# Patient Record
Sex: Female | Born: 1985 | Race: White | Hispanic: No | Marital: Single | Smoking: Current every day smoker
Health system: Southern US, Community
[De-identification: ages and names within clinical notes are randomized; demographics above are authoritative.]

## PROBLEM LIST (undated history)

## (undated) DIAGNOSIS — E119 Type 2 diabetes mellitus without complications: Secondary | ICD-10-CM

## (undated) DIAGNOSIS — F32A Depression, unspecified: Secondary | ICD-10-CM

## (undated) DIAGNOSIS — I10 Essential (primary) hypertension: Secondary | ICD-10-CM

## (undated) DIAGNOSIS — F329 Major depressive disorder, single episode, unspecified: Secondary | ICD-10-CM

## (undated) HISTORY — PX: GASTROSTOMY W/ FEEDING TUBE: SUR642

## (undated) HISTORY — PX: APPENDECTOMY: SHX54

---

## 2016-08-26 ENCOUNTER — Emergency Department (HOSPITAL_COMMUNITY)
Admission: EM | Admit: 2016-08-26 | Discharge: 2016-08-26 | Disposition: A | Discharge status: Home / Self Care | Payer: Medicaid Other | Attending: Emergency Medicine | Admitting: Emergency Medicine

## 2016-08-26 ENCOUNTER — Encounter (HOSPITAL_COMMUNITY): Payer: Self-pay | Admitting: Emergency Medicine

## 2016-08-26 DIAGNOSIS — F17200 Nicotine dependence, unspecified, uncomplicated: Secondary | ICD-10-CM | POA: Diagnosis not present

## 2016-08-26 DIAGNOSIS — R739 Hyperglycemia, unspecified: Secondary | ICD-10-CM

## 2016-08-26 DIAGNOSIS — R1030 Lower abdominal pain, unspecified: Secondary | ICD-10-CM | POA: Diagnosis present

## 2016-08-26 DIAGNOSIS — E1165 Type 2 diabetes mellitus with hyperglycemia: Secondary | ICD-10-CM | POA: Insufficient documentation

## 2016-08-26 DIAGNOSIS — I10 Essential (primary) hypertension: Secondary | ICD-10-CM | POA: Diagnosis not present

## 2016-08-26 DIAGNOSIS — N946 Dysmenorrhea, unspecified: Secondary | ICD-10-CM

## 2016-08-26 HISTORY — DX: Essential (primary) hypertension: I10

## 2016-08-26 HISTORY — DX: Depression, unspecified: F32.A

## 2016-08-26 HISTORY — DX: Major depressive disorder, single episode, unspecified: F32.9

## 2016-08-26 HISTORY — DX: Type 2 diabetes mellitus without complications: E11.9

## 2016-08-26 LAB — URINALYSIS, ROUTINE W REFLEX MICROSCOPIC
BILIRUBIN URINE: NEGATIVE
Glucose, UA: NEGATIVE mg/dL
Ketones, ur: NEGATIVE mg/dL
NITRITE: NEGATIVE
PH: 6.5 (ref 5.0–8.0)
Protein, ur: NEGATIVE mg/dL
SPECIFIC GRAVITY, URINE: 1.018 (ref 1.005–1.030)

## 2016-08-26 LAB — CBC
HEMATOCRIT: 38.3 % (ref 36.0–46.0)
HEMOGLOBIN: 12.3 g/dL (ref 12.0–15.0)
MCH: 28.2 pg (ref 26.0–34.0)
MCHC: 32.1 g/dL (ref 30.0–36.0)
MCV: 87.8 fL (ref 78.0–100.0)
Platelets: 336 10*3/uL (ref 150–400)
RBC: 4.36 MIL/uL (ref 3.87–5.11)
RDW: 13.3 % (ref 11.5–15.5)
WBC: 10.6 10*3/uL — ABNORMAL HIGH (ref 4.0–10.5)

## 2016-08-26 LAB — WET PREP, GENITAL
Sperm: NONE SEEN
Trich, Wet Prep: NONE SEEN
Yeast Wet Prep HPF POC: NONE SEEN

## 2016-08-26 LAB — COMPREHENSIVE METABOLIC PANEL
ALBUMIN: 3.8 g/dL (ref 3.5–5.0)
ALT: 16 U/L (ref 14–54)
ANION GAP: 6 (ref 5–15)
AST: 16 U/L (ref 15–41)
Alkaline Phosphatase: 61 U/L (ref 38–126)
BUN: 9 mg/dL (ref 6–20)
CHLORIDE: 104 mmol/L (ref 101–111)
CO2: 25 mmol/L (ref 22–32)
Calcium: 9.3 mg/dL (ref 8.9–10.3)
Creatinine, Ser: 0.72 mg/dL (ref 0.44–1.00)
GFR calc Af Amer: >60 mL/min (ref >60)
GFR calc non Af Amer: >60 mL/min (ref >60)
GLUCOSE: 218 mg/dL — AB (ref 65–99)
POTASSIUM: 3.8 mmol/L (ref 3.5–5.1)
Sodium: 135 mmol/L (ref 135–145)
TOTAL PROTEIN: 7 g/dL (ref 6.5–8.1)
Total Bilirubin: 0.3 mg/dL (ref 0.3–1.2)

## 2016-08-26 LAB — URINE MICROSCOPIC-ADD ON

## 2016-08-26 LAB — I-STAT BETA HCG BLOOD, ED (MC, WL, AP ONLY): I-stat hCG, quantitative: <5 m[IU]/mL (ref <5)

## 2016-08-26 LAB — LIPASE, BLOOD: Lipase: 38 U/L (ref 11–51)

## 2016-08-26 MED ORDER — NAPROXEN 500 MG PO TABS: 500.0000 mg | ORAL_TABLET | Freq: Two times a day (BID) | ORAL | 0 refills | Status: DC

## 2016-08-26 NOTE — ED Provider Notes (Signed)
[facility]-EMERGENCY DEPT Provider Note   CSN: [id number] Arrival date & time: 08/26/16  1515    History   Chief Complaint Chief Complaint  Patient presents with  . Abdominal Pain  . Numbness    HPI Marilyn Joseph is a 30 y.o. female.  30 year old female with a history of hypertension, depression, and diabetes mellitus presents to the emergency department for evaluation of lower abdominal pain. Patient states that she has experienced lower abdominal pain intermittently over the past 3-4 days. She describes the pain as cramping. She has not taken any medications for symptoms. She reports starting her menstrual cycle yesterday. She has also had associated diarrhea 2 days. Diarrhea has been watery and free of blood. No melena. Patient further denies nausea, vomiting, dysuria, fever, chills, or vaginal discharge. She has a history of feeding tube placement at the age of [age]. She also reports prior appendectomy.  As an aside, patient reports intermittent paresthesias from her left elbow extending to her left hand. This has often been positional in nature, such as when she has her arm around her significant other. She denies complete loss of sensation. She has had no recent trauma or injury to her left upper extremity. She denies any associated neck or back pain. She has had no extremity weakness or fever with this.   The history is provided by the patient. No language interpreter was used.  Abdominal Pain   Associated symptoms include diarrhea.    Past Medical History:  Diagnosis Date  . Depression   . Diabetes mellitus without complication (HCC)   . Hypertension     There are no active problems to display for this patient.   Past Surgical History:  Procedure Laterality Date  . GASTROSTOMY W/ FEEDING TUBE      OB History    No data available       Home Medications    Prior to Admission medications   Not on File    Family History No family history on file.  Social  History Social History  Substance Use Topics  . Smoking status: Current Some Day Smoker  . Smokeless tobacco: Not on file  . Alcohol use No     Allergies   Review of patient's allergies indicates no known allergies.   Review of Systems Review of Systems  Gastrointestinal: Positive for abdominal pain and diarrhea.  Genitourinary: Positive for vaginal bleeding (on menses).  Neurological: Negative for weakness.  Ten systems reviewed and are negative for acute change, except as noted in the HPI.    Physical Exam Updated Vital Signs BP 120/78 (BP Location: Right Arm)   Pulse 82   Temp 97.8 F (36.6 C) (Oral)   Resp 18   Ht 4\' 10"  (1.473 m)   Wt 72.6 kg   LMP 08/26/2016   SpO2 99%   BMI 33.44 kg/m   Physical Exam  Constitutional: She is oriented to person, place, and time. She appears well-developed and well-nourished. No distress.  Nontoxic appearing and in no distress  HENT:  Head: Normocephalic and atraumatic.  Eyes: Conjunctivae and EOM are normal. No scleral icterus.  Neck: Normal range of motion.  Cardiovascular: Normal rate, regular rhythm and intact distal pulses.   Pulmonary/Chest: Effort normal. No respiratory distress.  Respirations even and unlabored.  Abdominal: Soft. She exhibits no distension and no mass. There is tenderness. There is no guarding.  Soft, mildly obese abdomen. There is mild tenderness to palpation to the left suprapubic abdomen. No masses. No peritoneal  signs.  Genitourinary: There is no rash, tenderness, lesion or injury on the right labia. There is no rash, tenderness, lesion or injury on the left labia. Uterus is not tender. Cervix exhibits no motion tenderness. Right adnexum displays no tenderness. Left adnexum displays no tenderness. There is bleeding (mild blood in vaginal vault c/w menses) in the vagina.  Musculoskeletal: Normal range of motion.  Neurological: She is alert and oriented to person, place, and time.  GCS 15. Patient  moving all extremities.  Skin: Skin is warm and dry. No rash noted. She is not diaphoretic. No erythema. No pallor.  Psychiatric: She has a normal mood and affect. Her behavior is normal.  Nursing note and vitals reviewed.    ED Treatments / Results  Labs (all labs ordered are listed, but only abnormal results are displayed) Labs Reviewed  WET PREP, GENITAL - Abnormal; Notable for the following:       Result Value   Clue Cells Wet Prep HPF POC PRESENT (*)    WBC, Wet Prep HPF POC MANY (*)    All other components within normal limits  COMPREHENSIVE METABOLIC PANEL - Abnormal; Notable for the following:    Glucose, Bld 218 (*)    All other components within normal limits  CBC - Abnormal; Notable for the following:    WBC 10.6 (*)    All other components within normal limits  URINALYSIS, ROUTINE W REFLEX MICROSCOPIC (NOT AT [facility]) - Abnormal; Notable for the following:    APPearance CLOUDY (*)    Hgb urine dipstick LARGE (*)    Leukocytes, UA SMALL (*)    All other components within normal limits  URINE MICROSCOPIC-ADD ON - Abnormal; Notable for the following:    Squamous Epithelial / LPF 0-5 (*)    Bacteria, UA RARE (*)    All other components within normal limits  LIPASE, BLOOD  I-STAT BETA HCG BLOOD, ED ([facility], [facility], [facility] ONLY)  GC/CHLAMYDIA PROBE AMP ([place]) NOT AT [facility]    EKG  EKG Interpretation None       Radiology No results found.  Procedures Procedures (including critical care time)  Medications Ordered in ED Medications - No data to display   Initial Impression / Assessment and Plan / ED Course  I have reviewed the triage vital signs and the nursing notes.  Pertinent labs & imaging results that were available during my care of the patient were reviewed by me and considered in my medical decision making (see chart for details).  Clinical Course    30 year old female presents to the emergency department for complaints of lower abdominal pain which has  been cramping and intermittent for approximately 3 days. Patient with some mild lower abdominal tenderness. She has no peritoneal signs. No guarding. No cervical motion tenderness or adnexal tenderness appreciated on GU exam.  Patient has had no N/V. She has been having bowel movements. Doubt pSBO or SBO as cause of symptoms today. No concern for ruptured viscous. No RUQ TTP to suggest gallbladder etiology. Patient reports hx of appendectomy. No UTI or concern for ectopic today. Doubt TOA or torsion given chronicity of symptoms and lack of adnexal tenderness.  Patient has been afebrile with stable vital signs today. Her laboratory workup is reassuring. She has no significant leukocytosis. Gonorrhea and chlamydia cultures are pending. Given cramping in the setting of menses, suspect dysmenorrhea from irregular menstrual cycle. A CT scan has been offered to the patient which she declines because she "does not like  needles". Her abdominal exam is stable on repeat examination. I do not believe emergent imaging is indicated at this time.  Patient discharged with instruction for supportive care. She has been instructed to return to the emergency department for new or concerning symptoms. Primary care follow-up advised and return precautions given. Patient discharged in satisfactory condition with no unaddressed concerns.   Final Clinical Impressions(s) / ED Diagnoses   Final diagnoses:  Dysmenorrhea  Hyperglycemia without ketosis    New Prescriptions New Prescriptions   No medications on file     Antony Madura, PA-C 08/26/16 2230    Laurence Spates, MD 08/26/16 [phone]

## 2016-08-26 NOTE — ED Notes (Signed)
Patient called multiple times. Responded to bedside. Patient requesting confirmation that she will be ready to go by 2230. Advised patient that her workup is incomplete and I cannot guarantee this request; but that I will advise provider of her time constraint.

## 2016-08-26 NOTE — ED Triage Notes (Signed)
Pt c/o lower abdominal pain and L arm numbness that started x3 days ago. Denies n/v., pt c/o diarrhea x2. Pt denies urinary problems. Pt has had feeding tube in the past.

## 2016-08-26 NOTE — Discharge Instructions (Signed)
Take naproxen as prescribed for pain. Follow-up with a primary care doctor regarding your visit to the emergency department today. Return for any new or concerning symptoms. You have been tested for gonorrhea and chlamydia today. You will be notified by phone if you test positive for these sexually transmitted infections.

## 2016-08-27 LAB — GC/CHLAMYDIA PROBE AMP (~~LOC~~) NOT AT ARMC
Chlamydia: NEGATIVE
NEISSERIA GONORRHEA: NEGATIVE

## 2016-12-03 ENCOUNTER — Encounter (HOSPITAL_COMMUNITY): Payer: Self-pay | Admitting: Family Medicine

## 2016-12-03 ENCOUNTER — Ambulatory Visit (HOSPITAL_COMMUNITY)
Admission: EM | Admit: 2016-12-03 | Discharge: 2016-12-03 | Disposition: A | Discharge status: Home / Self Care | Payer: Medicaid Other | Attending: Family Medicine | Admitting: Family Medicine

## 2016-12-03 DIAGNOSIS — Z794 Long term (current) use of insulin: Secondary | ICD-10-CM | POA: Insufficient documentation

## 2016-12-03 DIAGNOSIS — Z880 Allergy status to penicillin: Secondary | ICD-10-CM | POA: Insufficient documentation

## 2016-12-03 DIAGNOSIS — F17200 Nicotine dependence, unspecified, uncomplicated: Secondary | ICD-10-CM | POA: Insufficient documentation

## 2016-12-03 DIAGNOSIS — Z79899 Other long term (current) drug therapy: Secondary | ICD-10-CM | POA: Insufficient documentation

## 2016-12-03 DIAGNOSIS — N390 Urinary tract infection, site not specified: Secondary | ICD-10-CM | POA: Insufficient documentation

## 2016-12-03 DIAGNOSIS — E119 Type 2 diabetes mellitus without complications: Secondary | ICD-10-CM

## 2016-12-03 DIAGNOSIS — R300 Dysuria: Secondary | ICD-10-CM | POA: Diagnosis present

## 2016-12-03 LAB — POCT URINALYSIS DIP (DEVICE)
BILIRUBIN URINE: NEGATIVE
Glucose, UA: 500 mg/dL — AB
Hgb urine dipstick: NEGATIVE
Ketones, ur: NEGATIVE mg/dL
NITRITE: NEGATIVE
Protein, ur: NEGATIVE mg/dL
Specific Gravity, Urine: 1.02 (ref 1.005–1.030)
UROBILINOGEN UA: 1 mg/dL (ref 0.0–1.0)
pH: 6.5 (ref 5.0–8.0)

## 2016-12-03 MED ORDER — CIPROFLOXACIN HCL 500 MG PO TABS: 500.0000 mg | ORAL_TABLET | Freq: Two times a day (BID) | ORAL | 0 refills | Status: DC

## 2016-12-03 NOTE — ED Provider Notes (Signed)
CSN: [id number]     Arrival date & time 12/03/16  1740 History   First MD Initiated Contact with Patient 12/03/16 1842     Chief Complaint  Patient presents with  . Dysuria   (Consider location/radiation/quality/duration/timing/severity/associated sxs/prior Treatment) 30 year old female with diabetes treated with insulin is complaining of burning with urination and malodorous urine. Also complaining of urinary frequency. Denies fever, chills, flank pain, abdominal pain fever or GI symptoms.      Past Medical History:  Diagnosis Date  . Depression   . Diabetes mellitus without complication (HCC)   . Hypertension    Past Surgical History:  Procedure Laterality Date  . GASTROSTOMY W/ FEEDING TUBE     History reviewed. No pertinent family history. Social History  Substance Use Topics  . Smoking status: Current Some Day Smoker  . Smokeless tobacco: Never Used  . Alcohol use No   OB History    No data available     Review of Systems  Constitutional: Negative.   Respiratory: Negative.   Genitourinary: Positive for dysuria, frequency and urgency. Negative for flank pain, menstrual problem, pelvic pain, vaginal bleeding and vaginal discharge.  Musculoskeletal: Negative.   Skin: Negative.   Neurological: Negative.     Allergies  Penicillins  Home Medications   Prior to Admission medications   Medication Sig Start Date End Date Taking? Authorizing Provider  albuterol (PROVENTIL HFA;VENTOLIN HFA) 108 (90 Base) MCG/ACT inhaler Inhale 1 puff into the lungs every 6 (six) hours as needed for wheezing or shortness of breath.    Historical Provider, MD  atorvastatin (LIPITOR) 10 MG tablet Take 10 mg by mouth daily.    Historical Provider, MD  budesonide-formoterol (SYMBICORT) 160-4.5 MCG/ACT inhaler Inhale 2 puffs into the lungs 2 (two) times daily.    Historical Provider, MD  ciprofloxacin (CIPRO) 500 MG tablet Take 1 tablet (500 mg total) by mouth 2 (two) times daily. 12/03/16    Hayden Rasmussenavid Kaithlyn Teagle, NP  desvenlafaxine (PRISTIQ) 100 MG 24 hr tablet Take 100 mg by mouth daily.    Historical Provider, MD  insulin glargine (LANTUS) 100 UNIT/ML injection Inject 40 Units into the skin at bedtime.    Historical Provider, MD  Melatonin 10 MG CAPS Take by mouth.    Historical Provider, MD  naproxen (NAPROSYN) 500 MG tablet Take 1 tablet (500 mg total) by mouth 2 (two) times daily. 08/26/16   Antony MaduraKelly Humes, PA-C   Meds Ordered and Administered this Visit  Medications - No data to display  BP 113/74 (BP Location: Left Arm)   Pulse 85   Temp 97.9 F (36.6 C) (Oral)   Resp 12   LMP 11/21/2016   SpO2 98%  No data found.   Physical Exam  Constitutional: She appears well-developed and well-nourished. No distress.  HENT:  Head: Normocephalic and atraumatic.  Neck: Neck supple.  Cardiovascular: Normal rate.   Pulmonary/Chest: Effort normal.  Abdominal: Soft.  Neurological: She is alert.  Skin: Skin is warm and dry.  Psychiatric: She has a normal mood and affect.  Nursing note and vitals reviewed.   Urgent Care Course   Clinical Course     Procedures (including critical care time)  Labs Review Labs Reviewed  POCT URINALYSIS DIP (DEVICE) - Abnormal; Notable for the following:       Result Value   Glucose, UA 500 (*)    Leukocytes, UA TRACE (*)    All other components within normal limits    Imaging Review No results found.  Visual Acuity Review  Right Eye Distance:   Left Eye Distance:   Bilateral Distance:    Right Eye Near:   Left Eye Near:    Bilateral Near:         MDM   1. Lower urinary tract infectious disease   2. Type 2 diabetes mellitus without complication, with long-term current use of insulin (HCC)    Drink plenty of fluids and stay well-hydrated. Be sure to check your blood sugars frequently. Take your antibiotic as directed and follow-up with your primary care doctor as needed. Meds ordered this encounter  Medications  .  ciprofloxacin (CIPRO) 500 MG tablet    Sig: Take 1 tablet (500 mg total) by mouth 2 (two) times daily.    Dispense:  14 tablet    Refill:  0    Order Specific Question:   Supervising Provider    Answer:   Linna HoffKINDL, JAMES D [[id number]]       Hayden Rasmussenavid Evalyn Shultis, NP 12/03/16 [phone]

## 2016-12-03 NOTE — Discharge Instructions (Signed)
Drink plenty of fluids and stay well-hydrated. Be sure to check your blood sugars frequently. Take your antibiotic as directed and follow-up with your primary care doctor as needed.

## 2016-12-03 NOTE — ED Triage Notes (Signed)
Pt here for dysuria and frequency with urination x 1 week.

## 2016-12-06 LAB — URINE CULTURE

## 2016-12-09 ENCOUNTER — Telehealth (HOSPITAL_COMMUNITY): Payer: Self-pay | Admitting: Emergency Medicine

## 2016-12-09 NOTE — Telephone Encounter (Signed)
-----   Message from Eustace MooreLaura W Murray, MD sent at 12/06/2016  2:00 PM EST ----- Please let patient know that urine culture was positive for E coli, sensitive to cipro.  [facility].  Recheck or followup with PCP for further evaluation if symptoms persist.  LM

## 2016-12-09 NOTE — Telephone Encounter (Signed)
Pt called back... notified of recent lab results Pt ID'd properly... Reports feeling better and sx have subsided Pt is taking/tolarating well meds given at visit.  Adv pt if sx are not getting better to return or to f/u w/PCP Pt verb understanding.

## 2016-12-09 NOTE — Telephone Encounter (Signed)
LM on [phone] Called to give lab results and to see how pt is doing from recent visit on 12/03/16 Notified pt in gen message of abnormal results and that there is NO need to call back Unless not feeling any better, not tolerating meds well or if wanting to know lab results. Also let pt know labs can be obtained from MyChart

## 2017-01-02 ENCOUNTER — Ambulatory Visit (HOSPITAL_COMMUNITY)
Admission: EM | Admit: 2017-01-02 | Discharge: 2017-01-02 | Disposition: A | Discharge status: Home / Self Care | Payer: Medicaid Other | Attending: Emergency Medicine | Admitting: Emergency Medicine

## 2017-01-02 ENCOUNTER — Encounter (HOSPITAL_COMMUNITY): Payer: Self-pay | Admitting: Emergency Medicine

## 2017-01-02 DIAGNOSIS — R6884 Jaw pain: Secondary | ICD-10-CM | POA: Diagnosis not present

## 2017-01-02 NOTE — Discharge Instructions (Signed)
You no obvious trauma to the jaw.  You may use ice on the jaw for for pain relief as needed for 15 minutes at a time. You may take tylenol or motrin over the counter as needed for additional pain relief.  Should symptoms fail to improve or worsen follow up with your primary care provider or return to clinic.
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
## 2017-01-06 ENCOUNTER — Emergency Department (HOSPITAL_COMMUNITY)
Admission: EM | Admit: 2017-01-06 | Discharge: 2017-01-06 | Disposition: A | Discharge status: Home / Self Care | Payer: Medicaid Other | Attending: Emergency Medicine | Admitting: Emergency Medicine

## 2017-01-06 ENCOUNTER — Encounter (HOSPITAL_COMMUNITY): Payer: Self-pay

## 2017-01-06 DIAGNOSIS — Z87891 Personal history of nicotine dependence: Secondary | ICD-10-CM | POA: Insufficient documentation

## 2017-01-06 DIAGNOSIS — E119 Type 2 diabetes mellitus without complications: Secondary | ICD-10-CM | POA: Diagnosis not present

## 2017-01-06 DIAGNOSIS — H9201 Otalgia, right ear: Secondary | ICD-10-CM | POA: Diagnosis present

## 2017-01-06 DIAGNOSIS — I10 Essential (primary) hypertension: Secondary | ICD-10-CM | POA: Diagnosis not present

## 2017-01-06 DIAGNOSIS — H65191 Other acute nonsuppurative otitis media, right ear: Secondary | ICD-10-CM

## 2017-01-06 MED ORDER — OXYCODONE-ACETAMINOPHEN 7.5-325 MG PO TABS
2.0000 | ORAL_TABLET | Freq: Once | ORAL | Status: AC
  Administered 2017-01-06: 2 via ORAL
  Filled 2017-01-06: qty 2

## 2017-01-06 MED ORDER — CLINDAMYCIN HCL 300 MG PO CAPS: 600.0000 mg | ORAL_CAPSULE | Freq: Three times a day (TID) | ORAL | 0 refills | Status: AC

## 2017-01-06 NOTE — Discharge Instructions (Signed)
Given your allergy to penicillin you have been prescribed clindamycin for your ear infection. Please take this antibiotic as prescribed.  Take ibuprofen 600mg  three time a day for pain.   Follow up with your primary care provider if your symptoms worsen

## 2017-01-06 NOTE — ED Triage Notes (Signed)
Pt state she started having right ear pain 1 hour ago. Pt state pain at 9/10 on arrival. Pt a&ox 4 on arrival. Pt denies any other symptoms at triage.

## 2017-01-06 NOTE — ED Provider Notes (Signed)
[facility]-EMERGENCY DEPT Provider Note   CSN: [id number] Arrival date & time: 01/06/17  0609     History   Chief Complaint Chief Complaint  Patient presents with  . Otalgia    HPI Marilyn Joseph is a 31 y.o. female with pertinent pmh of T2DM on insulin presents with 9/10 R ear pain that started this morning when she woke up.  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  Pt denies recent swimming or water in ear.  Pt denies itching, discharge, fevers. No changes in hearing. Pt reports mild nasal congestion. No chest pain, shortness of breath, n/v/d/c, abdominal pain.   HPI  Past Medical History:  Diagnosis Date  . Depression   . Diabetes mellitus without complication (HCC)   . Hypertension     There are no active problems to display for this patient.   Past Surgical History:  Procedure Laterality Date  . GASTROSTOMY W/ FEEDING TUBE      OB History    No data available       Home Medications    Prior to Admission medications   Medication Sig Start Date End Date Taking? Authorizing Provider  albuterol (PROVENTIL HFA;VENTOLIN HFA) 108 (90 Base) MCG/ACT inhaler Inhale 1 puff into the lungs every 6 (six) hours as needed for wheezing or shortness of breath.    Historical Provider, MD  atorvastatin (LIPITOR) 10 MG tablet Take 10 mg by mouth daily.    Historical Provider, MD  budesonide-formoterol (SYMBICORT) 160-4.5 MCG/ACT inhaler Inhale 2 puffs into the lungs 2 (two) times daily.    Historical Provider, MD  ciprofloxacin (CIPRO) 500 MG tablet Take 1 tablet (500 mg total) by mouth 2 (two) times daily. 12/03/16   Hayden Rasmussen, NP  clindamycin (CLEOCIN) 300 MG capsule Take 2 capsules (600 mg total) by mouth 3 (three) times daily. 01/06/17 01/13/17  Liberty Handy, PA-C  cyclobenzaprine (FLEXERIL) 10 MG tablet Take 10 mg by mouth 3 (three) times daily as needed for muscle spasms.    Historical Provider, MD  desvenlafaxine (PRISTIQ) 100 MG 24 hr tablet Take  100 mg by mouth daily.    Historical Provider, MD  FLUoxetine (PROZAC) 10 MG tablet Take 10 mg by mouth daily.    Historical Provider, MD  insulin glargine (LANTUS) 100 UNIT/ML injection Inject 40 Units into the skin at bedtime.    Historical Provider, MD  Melatonin 10 MG CAPS Take by mouth.    Historical Provider, MD  naproxen (NAPROSYN) 500 MG tablet Take 1 tablet (500 mg total) by mouth 2 (two) times daily. 08/26/16   Antony Madura, PA-C  traMADol (ULTRAM) 50 MG tablet Take by mouth every 6 (six) hours as needed.    Historical Provider, MD    Family History No family history on file.  Social History Social History  Substance Use Topics  . Smoking status: Former Smoker    Quit date: 06/06/2016  . Smokeless tobacco: Never Used  . Alcohol use No     Allergies   Penicillins   Review of Systems Review of Systems  Constitutional: Negative for chills and fever.  HENT: Positive for congestion and ear pain. Negative for ear discharge, hearing loss, postnasal drip, sinus pain, sinus pressure and sore throat.   Eyes: Negative for visual disturbance.  Respiratory: Negative for shortness of breath.   Cardiovascular: Negative for chest pain.  Gastrointestinal: Negative for abdominal pain, constipation, diarrhea, nausea and vomiting.  Genitourinary: Negative for difficulty urinating and hematuria.  Musculoskeletal: Negative for arthralgias.  Neurological: Positive for headaches. Negative for dizziness and light-headedness.     Physical Exam Updated Vital Signs BP 127/87 (BP Location: Right Arm)   Pulse 91   Temp 97.7 F (36.5 C) (Oral)   Resp 18   Ht 4\' 10"  (1.473 m)   LMP 12/21/2016   SpO2 100%   Physical Exam  Constitutional: She is oriented to person, place, and time. She appears well-developed and well-nourished. No distress.  Pt found asleep on chair, easily arousable. Alert.  HENT:  Head: Normocephalic and atraumatic.  Nose: Nose normal.  Mouth/Throat: Oropharynx is clear  and moist. No oropharyngeal exudate.  Erythematous, non bulging R TM without vesicles or air fluid levels.  Normal L TM with tympanostomy tube in place.  Eyes: Conjunctivae and EOM are normal. Pupils are equal, round, and reactive to light.  Neck: Normal range of motion. Neck supple. No JVD present.  Cardiovascular: Normal rate, regular rhythm and normal heart sounds.   No murmur heard. Pulmonary/Chest: Effort normal and breath sounds normal. No respiratory distress. She has no wheezes. She has no rales.  Abdominal: There is no rebound.  Musculoskeletal: Normal range of motion. She exhibits no deformity.  Lymphadenopathy:    She has no cervical adenopathy.  Neurological: She is alert and oriented to person, place, and time. No sensory deficit.  Hearing intact to finger rub bilaterally.  Skin: Skin is warm and dry. Capillary refill takes less than 2 seconds.  Psychiatric: She has a normal mood and affect. Her behavior is normal. Judgment and thought content normal.  Nursing note and vitals reviewed.    ED Treatments / Results  Labs (all labs ordered are listed, but only abnormal results are displayed) Labs Reviewed - No data to display  EKG  EKG Interpretation None       Radiology No results found.  Procedures Procedures (including critical care time)  Medications Ordered in ED Medications  oxyCODONE-acetaminophen (PERCOCET) 7.5-325 MG per tablet 2 tablet (2 tablets Oral Given 01/06/17 0849)     Initial Impression / Assessment and Plan / ED Course  I have reviewed the triage vital signs and the nursing notes.  Pertinent labs & imaging results that were available during my care of the patient were reviewed by me and considered in my medical decision making (see chart for details).  Clinical Course    31 yo T2DM on insulin with AOM.  R TM without vesicles, non bulging, without air fluid lines.  Hearing to finger rub intact bilaterally.  Will treat with clindamycin given  pt severe allergy to penicillin (immediate rash, per patient).  Instructed pt to take 600 mg ibuprofen scheduled for pain.  Pt will be discharged with close PCP f/u for ear check if symptoms do not resolve.  Pt agreeable to discharge plan. ED return instructions given.   Final Clinical Impressions(s) / ED Diagnoses   Final diagnoses:  Other acute nonsuppurative otitis media of right ear, recurrence not specified    New Prescriptions New Prescriptions   CLINDAMYCIN (CLEOCIN) 300 MG CAPSULE    Take 2 capsules (600 mg total) by mouth 3 (three) times daily.     Liberty Handylaudia J Gibbons, PA-C 01/06/17 0908    Liberty Handylaudia J Gibbons, PA-C 01/06/17 [id number]    Canary Brimhristopher J Tegeler, MD 01/06/17 1958

## 2017-01-25 ENCOUNTER — Ambulatory Visit (HOSPITAL_COMMUNITY)
Admission: EM | Admit: 2017-01-25 | Discharge: 2017-01-25 | Disposition: A | Discharge status: Home / Self Care | Payer: Medicaid Other

## 2017-04-01 ENCOUNTER — Ambulatory Visit (HOSPITAL_COMMUNITY)
Admission: EM | Admit: 2017-04-01 | Discharge: 2017-04-01 | Disposition: A | Discharge status: Home / Self Care | Payer: Medicaid Other | Attending: Family Medicine | Admitting: Family Medicine

## 2017-04-01 ENCOUNTER — Encounter (HOSPITAL_COMMUNITY): Payer: Self-pay | Admitting: Emergency Medicine

## 2017-04-01 ENCOUNTER — Emergency Department (HOSPITAL_COMMUNITY)
Admission: EM | Admit: 2017-04-01 | Discharge: 2017-04-01 | Discharge status: Against Medical Advice | Payer: Medicaid Other

## 2017-04-01 DIAGNOSIS — J069 Acute upper respiratory infection, unspecified: Secondary | ICD-10-CM | POA: Diagnosis not present

## 2017-04-01 DIAGNOSIS — B9789 Other viral agents as the cause of diseases classified elsewhere: Secondary | ICD-10-CM | POA: Diagnosis not present

## 2017-04-01 MED ORDER — BENZONATATE 100 MG PO CAPS: 100.0000 mg | ORAL_CAPSULE | Freq: Three times a day (TID) | ORAL | 0 refills | Status: DC

## 2017-04-01 NOTE — ED Notes (Signed)
Pt called for triage, no answer

## 2017-04-01 NOTE — ED Provider Notes (Signed)
CSN: [id number]     Arrival date & time 04/01/17  1227 History   None    Chief Complaint  Patient presents with  . Cough   (Consider location/radiation/quality/duration/timing/severity/associated sxs/prior Treatment) 31 year old female presents to clinic for evaluation of a cough that started this morning when she woke up.   The history is provided by the patient.  Cough  Cough characteristics:  Non-productive, hacking and harsh Sputum characteristics:  Clear Severity:  Moderate Onset quality:  Sudden Duration:  5 hours Timing:  Intermittent Progression:  Worsening Chronicity:  New Smoker: no   Context: sick contacts and upper respiratory infection   Relieved by:  None tried Worsened by:  Nothing Ineffective treatments:  None tried Associated symptoms: chills, rhinorrhea and sinus congestion   Associated symptoms: no chest pain, no ear fullness, no ear pain, no eye discharge, no fever, no headaches, no myalgias, no shortness of breath, no sore throat and no wheezing     Past Medical History:  Diagnosis Date  . Depression   . Diabetes mellitus without complication (HCC)   . Hypertension    Past Surgical History:  Procedure Laterality Date  . GASTROSTOMY W/ FEEDING TUBE     History reviewed. No pertinent family history. Social History  Substance Use Topics  . Smoking status: Former Smoker    Quit date: 06/06/2016  . Smokeless tobacco: Never Used  . Alcohol use No   OB History    No data available     Review of Systems  Constitutional: Positive for chills. Negative for appetite change and fever.  HENT: Positive for rhinorrhea. Negative for congestion, ear pain, sinus pain, sinus pressure, sore throat and trouble swallowing.   Eyes: Negative for discharge and itching.  Respiratory: Positive for cough. Negative for shortness of breath and wheezing.   Cardiovascular: Negative for chest pain and palpitations.  Gastrointestinal: Negative for abdominal pain,  constipation, diarrhea, nausea and vomiting.  Genitourinary: Negative for dysuria and frequency.  Musculoskeletal: Negative for myalgias, neck pain and neck stiffness.  Skin: Negative for pallor.  Neurological: Negative for dizziness, light-headedness and headaches.    Allergies  Penicillins  Home Medications   Prior to Admission medications   Medication Sig Start Date End Date Taking? Authorizing Provider  albuterol (PROVENTIL HFA;VENTOLIN HFA) 108 (90 Base) MCG/ACT inhaler Inhale 1 puff into the lungs every 6 (six) hours as needed for wheezing or shortness of breath.    Historical Provider, MD  atorvastatin (LIPITOR) 10 MG tablet Take 10 mg by mouth daily.    Historical Provider, MD  benzonatate (TESSALON) 100 MG capsule Take 1 capsule (100 mg total) by mouth every 8 (eight) hours. 04/01/17   Dorena Bodo, NP  budesonide-formoterol [facility]) 160-4.5 MCG/ACT inhaler Inhale 2 puffs into the lungs 2 (two) times daily.    Historical Provider, MD  ciprofloxacin (CIPRO) 500 MG tablet Take 1 tablet (500 mg total) by mouth 2 (two) times daily. 12/03/16   Hayden Rasmussen, NP  cyclobenzaprine (FLEXERIL) 10 MG tablet Take 10 mg by mouth 3 (three) times daily as needed for muscle spasms.    Historical Provider, MD  desvenlafaxine (PRISTIQ) 100 MG 24 hr tablet Take 100 mg by mouth daily.    Historical Provider, MD  FLUoxetine (PROZAC) 10 MG tablet Take 10 mg by mouth daily.    Historical Provider, MD  insulin glargine (LANTUS) 100 UNIT/ML injection Inject 40 Units into the skin at bedtime.    Historical Provider, MD  Melatonin 10 MG CAPS Take by  mouth.    Historical Provider, MD  naproxen (NAPROSYN) 500 MG tablet Take 1 tablet (500 mg total) by mouth 2 (two) times daily. 08/26/16   Antony Madura, PA-C  traMADol (ULTRAM) 50 MG tablet Take by mouth every 6 (six) hours as needed.    Historical Provider, MD   Meds Ordered and Administered this Visit  Medications - No data to display  BP 111/74 (BP  Location: Right Arm)   Pulse (!) 106   Temp 97.5 F (36.4 C) (Oral)   Resp 20   SpO2 97%  No data found.   Physical Exam  Constitutional: She is oriented to person, place, and time. She appears well-developed and well-nourished. She appears ill. No distress.  HENT:  Head: Normocephalic and atraumatic.  Right Ear: Tympanic membrane and external ear normal.  Left Ear: Tympanic membrane and external ear normal.  Nose: Rhinorrhea present. Right sinus exhibits no maxillary sinus tenderness and no frontal sinus tenderness. Left sinus exhibits no maxillary sinus tenderness and no frontal sinus tenderness.  Mouth/Throat: Uvula is midline and oropharynx is clear and moist. No oropharyngeal exudate.  Eyes: Pupils are equal, round, and reactive to light.  Neck: Normal range of motion. Neck supple. No JVD present.  Cardiovascular: Normal rate and regular rhythm.   Pulmonary/Chest: Effort normal and breath sounds normal. No respiratory distress. She has no wheezes.  Abdominal: Soft. Bowel sounds are normal. She exhibits no distension. There is no tenderness. There is no guarding.  Lymphadenopathy:       Head (right side): No submental, no submandibular, no tonsillar and no preauricular adenopathy present.       Head (left side): No submental, no submandibular, no tonsillar and no preauricular adenopathy present.    She has no cervical adenopathy.  Neurological: She is alert and oriented to person, place, and time.  Skin: Skin is warm and dry. Capillary refill takes less than 2 seconds. She is not diaphoretic.  Psychiatric: She has a normal mood and affect. Her behavior is normal.  Nursing note and vitals reviewed.   Urgent Care Course     Procedures (including critical care time)  Labs Review Labs Reviewed - No data to display  Imaging Review No results found.    MDM   1. Viral URI with cough    Given prescription for Tessalon, for cough. Provided counseling on over-the-counter  therapies for symptom management. Advised this is viral,  should start feeling better in about a week. If any time symptoms worsen, developing shortness of breath, wheezing, high fevers unrelieved by Tylenol, return to clinic or go to the ER.     Dorena Bodo, NP 04/01/17 1258

## 2017-04-01 NOTE — Discharge Instructions (Signed)
You most likely have a viral URI, I advise rest, plenty of fluids and management of symptoms with over the counter medicines. For symptoms you may take Tylenol as needed every 4-6 hours for body aches or fever, not to exceed 4,000 mg a day, Take mucinex or mucinex DM ever 12 hours with a full glass of water, you may use an inhaled steroid such as Flonase, 2 sprays each nostril once a day for congestion, or an antihistamine such as Claritin or Zyrtec once a day. For cough, I have prescribed a medication called Tessalon. Take 1 tablet every 8 hours as needed for your cough. Should your symptoms worsen or fail to resolve, follow up with your primary care provider or return to clinic.  °

## 2017-04-01 NOTE — ED Triage Notes (Signed)
The patient presented to the [facility] with a complaint of a cough and chest tightness that started this am.

## 2017-04-01 NOTE — ED Notes (Signed)
Called for Pt in lobby no response. 

## 2017-04-03 ENCOUNTER — Emergency Department (HOSPITAL_COMMUNITY): Payer: Medicaid Other

## 2017-04-03 ENCOUNTER — Encounter (HOSPITAL_COMMUNITY): Payer: Self-pay | Admitting: Emergency Medicine

## 2017-04-03 DIAGNOSIS — Z5321 Procedure and treatment not carried out due to patient leaving prior to being seen by health care provider: Secondary | ICD-10-CM | POA: Insufficient documentation

## 2017-04-03 DIAGNOSIS — R0789 Other chest pain: Secondary | ICD-10-CM | POA: Insufficient documentation

## 2017-04-03 DIAGNOSIS — R05 Cough: Secondary | ICD-10-CM | POA: Diagnosis not present

## 2017-04-03 LAB — I-STAT TROPONIN, ED: TROPONIN I, POC: 0 ng/mL (ref 0.00–0.08)

## 2017-04-03 LAB — CBC WITH DIFFERENTIAL/PLATELET
BASOS ABS: 0 10*3/uL (ref 0.0–0.1)
Basophils Relative: 0 %
Eosinophils Absolute: 0.3 10*3/uL (ref 0.0–0.7)
Eosinophils Relative: 3 %
HEMATOCRIT: 39.7 % (ref 36.0–46.0)
HEMOGLOBIN: 13.2 g/dL (ref 12.0–15.0)
LYMPHS ABS: 5 10*3/uL — AB (ref 0.7–4.0)
LYMPHS PCT: 48 %
MCH: 28 pg (ref 26.0–34.0)
MCHC: 33.2 g/dL (ref 30.0–36.0)
MCV: 84.1 fL (ref 78.0–100.0)
Monocytes Absolute: 0.7 10*3/uL (ref 0.1–1.0)
Monocytes Relative: 7 %
NEUTROS PCT: 42 %
Neutro Abs: 4.2 10*3/uL (ref 1.7–7.7)
PLATELETS: 313 10*3/uL (ref 150–400)
RBC: 4.72 MIL/uL (ref 3.87–5.11)
RDW: 13.7 % (ref 11.5–15.5)
WBC: 10.2 10*3/uL (ref 4.0–10.5)

## 2017-04-03 LAB — COMPREHENSIVE METABOLIC PANEL
ALT: 14 U/L (ref 14–54)
AST: 17 U/L (ref 15–41)
Albumin: 3.9 g/dL (ref 3.5–5.0)
Alkaline Phosphatase: 62 U/L (ref 38–126)
Anion gap: 10 (ref 5–15)
BILIRUBIN TOTAL: 0.2 mg/dL — AB (ref 0.3–1.2)
BUN: 9 mg/dL (ref 6–20)
CHLORIDE: 98 mmol/L — AB (ref 101–111)
CO2: 23 mmol/L (ref 22–32)
CREATININE: 0.88 mg/dL (ref 0.44–1.00)
Calcium: 9.1 mg/dL (ref 8.9–10.3)
Glucose, Bld: 261 mg/dL — ABNORMAL HIGH (ref 65–99)
Potassium: 3.8 mmol/L (ref 3.5–5.1)
Sodium: 131 mmol/L — ABNORMAL LOW (ref 135–145)
Total Protein: 7.6 g/dL (ref 6.5–8.1)

## 2017-04-03 NOTE — ED Triage Notes (Signed)
Pt to ED with c/o productive cough (yellow), chest tightness and congestion.  Pt was seen at Urgent Care 2 days ago and dx with a virus.

## 2017-04-04 ENCOUNTER — Emergency Department (HOSPITAL_COMMUNITY)
Admission: EM | Admit: 2017-04-04 | Discharge: 2017-04-04 | Disposition: A | Discharge status: Home / Self Care | Payer: Medicaid Other | Attending: Dermatology | Admitting: Dermatology

## 2017-04-04 NOTE — ED Triage Notes (Signed)
Called for room x 2, no answer. Moved name back to waiting area.

## 2017-04-04 NOTE — ED Notes (Signed)
Called for room again, no answer.

## 2017-11-07 ENCOUNTER — Emergency Department (HOSPITAL_COMMUNITY)
Admission: EM | Admit: 2017-11-07 | Discharge: 2017-11-07 | Disposition: A | Discharge status: Home / Self Care | Payer: Medicaid Other | Attending: Emergency Medicine | Admitting: Emergency Medicine

## 2017-11-07 ENCOUNTER — Encounter (HOSPITAL_COMMUNITY): Payer: Self-pay

## 2017-11-07 ENCOUNTER — Other Ambulatory Visit: Payer: Self-pay

## 2017-11-07 DIAGNOSIS — Z79899 Other long term (current) drug therapy: Secondary | ICD-10-CM | POA: Insufficient documentation

## 2017-11-07 DIAGNOSIS — R1030 Lower abdominal pain, unspecified: Secondary | ICD-10-CM | POA: Diagnosis not present

## 2017-11-07 DIAGNOSIS — F17210 Nicotine dependence, cigarettes, uncomplicated: Secondary | ICD-10-CM | POA: Diagnosis not present

## 2017-11-07 DIAGNOSIS — E119 Type 2 diabetes mellitus without complications: Secondary | ICD-10-CM | POA: Diagnosis not present

## 2017-11-07 DIAGNOSIS — I10 Essential (primary) hypertension: Secondary | ICD-10-CM | POA: Insufficient documentation

## 2017-11-07 DIAGNOSIS — N3000 Acute cystitis without hematuria: Secondary | ICD-10-CM

## 2017-11-07 DIAGNOSIS — Z794 Long term (current) use of insulin: Secondary | ICD-10-CM | POA: Insufficient documentation

## 2017-11-07 LAB — CBC
HCT: 43.1 % (ref 36.0–46.0)
Hemoglobin: 14.4 g/dL (ref 12.0–15.0)
MCH: 28.6 pg (ref 26.0–34.0)
MCHC: 33.4 g/dL (ref 30.0–36.0)
MCV: 85.7 fL (ref 78.0–100.0)
PLATELETS: 345 10*3/uL (ref 150–400)
RBC: 5.03 MIL/uL (ref 3.87–5.11)
RDW: 13.2 % (ref 11.5–15.5)
WBC: 13.7 10*3/uL — AB (ref 4.0–10.5)

## 2017-11-07 LAB — COMPREHENSIVE METABOLIC PANEL
ALT: 14 U/L (ref 14–54)
AST: 16 U/L (ref 15–41)
Albumin: 4.2 g/dL (ref 3.5–5.0)
Alkaline Phosphatase: 70 U/L (ref 38–126)
Anion gap: 8 (ref 5–15)
BUN: 11 mg/dL (ref 6–20)
CHLORIDE: 102 mmol/L (ref 101–111)
CO2: 24 mmol/L (ref 22–32)
CREATININE: 0.91 mg/dL (ref 0.44–1.00)
Calcium: 9.6 mg/dL (ref 8.9–10.3)
GFR calc Af Amer: >60 mL/min (ref >60)
GLUCOSE: 157 mg/dL — AB (ref 65–99)
Potassium: 4.2 mmol/L (ref 3.5–5.1)
Sodium: 134 mmol/L — ABNORMAL LOW (ref 135–145)
Total Bilirubin: 0.5 mg/dL (ref 0.3–1.2)
Total Protein: 7.4 g/dL (ref 6.5–8.1)

## 2017-11-07 LAB — URINALYSIS, ROUTINE W REFLEX MICROSCOPIC
Bilirubin Urine: NEGATIVE
Glucose, UA: NEGATIVE mg/dL
Hgb urine dipstick: NEGATIVE
Ketones, ur: NEGATIVE mg/dL
Nitrite: NEGATIVE
PH: 6 (ref 5.0–8.0)
Protein, ur: NEGATIVE mg/dL
SPECIFIC GRAVITY, URINE: 1.017 (ref 1.005–1.030)

## 2017-11-07 LAB — I-STAT BETA HCG BLOOD, ED (MC, WL, AP ONLY)

## 2017-11-07 LAB — LIPASE, BLOOD: LIPASE: 28 U/L (ref 11–51)

## 2017-11-07 MED ORDER — KETOROLAC TROMETHAMINE 30 MG/ML IJ SOLN
15.0000 mg | Freq: Once | INTRAMUSCULAR | Status: AC
  Administered 2017-11-07: 15 mg via INTRAMUSCULAR
  Filled 2017-11-07: qty 1

## 2017-11-07 MED ORDER — SULFAMETHOXAZOLE-TRIMETHOPRIM 800-160 MG PO TABS: 1.0000 | ORAL_TABLET | Freq: Two times a day (BID) | ORAL | 0 refills | Status: AC

## 2017-11-07 MED ORDER — PHENAZOPYRIDINE HCL 100 MG PO TABS
95.0000 mg | ORAL_TABLET | Freq: Once | ORAL | Status: AC
  Administered 2017-11-07: 100 mg via ORAL
  Filled 2017-11-07: qty 1

## 2017-11-07 MED ORDER — PHENAZOPYRIDINE HCL 200 MG PO TABS: 200.0000 mg | ORAL_TABLET | Freq: Three times a day (TID) | ORAL | 0 refills | Status: DC

## 2017-11-07 MED ORDER — SULFAMETHOXAZOLE-TRIMETHOPRIM 800-160 MG PO TABS
1.0000 | ORAL_TABLET | Freq: Once | ORAL | Status: AC
  Administered 2017-11-07: 1 via ORAL
  Filled 2017-11-07: qty 1

## 2017-11-07 NOTE — Discharge Instructions (Signed)
As discussed, your evaluation today has been largely reassuring.  But, it is important that you monitor your condition carefully, and do not hesitate to return to the ED if you develop new, or concerning changes in your condition. ? ?Otherwise, please follow-up with your physician for appropriate ongoing care. ? ?

## 2017-11-07 NOTE — ED Provider Notes (Signed)
[facility] Provider Note   CSN: [id number] Arrival date & time: 11/07/17  1246     History   Chief Complaint Chief Complaint  Patient presents with  . Abdominal Pain    HPI Marilyn Joseph is a 31 y.o. female.  HPI Patient presents with concern of lower abdominal pain. Pain is been present for 3 or 4 days. Pain is focally in the suprapubic region, nonradiating, though she has occasional pain superiorly. No lateral abdominal pain. There is associated polyuria, and occasional nausea, but no vomiting, no diarrhea, no dysuria, no hematuria. No medication taken for pain relief.  No vaginal bleeding, discharge. Last menstrual period approximately 7 weeks ago.   Past Medical History:  Diagnosis Date  . Depression   . Diabetes mellitus without complication (HCC)   . Hypertension     There are no active problems to display for this patient.   Past Surgical History:  Procedure Laterality Date  . GASTROSTOMY W/ FEEDING TUBE      OB History    No data available       Home Medications    Prior to Admission medications   Medication Sig Start Date End Date Taking? Authorizing Provider  albuterol (PROVENTIL HFA;VENTOLIN HFA) 108 (90 Base) MCG/ACT inhaler Inhale 1 puff into the lungs every 6 (six) hours as needed for wheezing or shortness of breath.   Yes [provider]  budesonide-formoterol (SYMBICORT) 160-4.5 MCG/ACT inhaler Inhale 2 puffs as needed into the lungs.    Yes [provider]  buPROPion (WELLBUTRIN SR) 150 MG 12 hr tablet Take 150 mg 2 (two) times daily by mouth. 09/30/17  Yes [provider]  busPIRone (BUSPAR) 15 MG tablet Take 15 mg 2 (two) times daily by mouth. 10/30/17  Yes [provider]  cetirizine (ZYRTEC) 10 MG tablet Take 10 mg daily by mouth. 09/03/17  Yes [provider]  cyclobenzaprine (FLEXERIL) 10 MG tablet Take 10 mg 2 (two) times daily as needed by mouth for  muscle spasms.    Yes [provider]  FLUoxetine (PROZAC) 20 MG capsule Take 40 mg daily by mouth.    Yes [provider]  lamoTRIgine (LAMICTAL) 100 MG tablet Take 100 mg daily by mouth. 10/30/17  Yes [provider]  NOVOLOG MIX 70/30 FLEXPEN (70-30) 100 UNIT/ML FlexPen Inject 25-50 Units See admin instructions into the skin. 50 units in the morning and 25 in the evening 10/30/17  Yes [provider]  REXULTI 2 MG TABS Take 1 tablet daily by mouth. 10/30/17  Yes [provider]  traZODone (DESYREL) 100 MG tablet Take 100 mg at bedtime by mouth.   Yes [provider]  zolpidem (AMBIEN) 10 MG tablet Take 10 mg at bedtime by mouth. 10/30/17  Yes [provider]  phenazopyridine (PYRIDIUM) 200 MG tablet Take 1 tablet (200 mg total) 3 (three) times daily by mouth. 11/07/17   Gerhard MunchLockwood, Vivyan Biggers, MD  sulfamethoxazole-trimethoprim (BACTRIM DS,SEPTRA DS) 800-160 MG tablet Take 1 tablet 2 (two) times daily for 7 days by mouth. 11/07/17 11/14/17  Gerhard MunchLockwood, Yadriel Kerrigan, MD    Family History History reviewed. No pertinent family history.  Social History Social History   Tobacco Use  . Smoking status: Current Every Day Smoker    Packs/day: 0.50    Types: Cigarettes  . Smokeless tobacco: Never Used  Substance Use Topics  . Alcohol use: No  . Drug use: No     Allergies   Clindamycin/lincomycin and  Penicillins   Review of Systems Review of Systems  Constitutional:       Per HPI, otherwise negative  HENT:       Per HPI, otherwise negative  Respiratory:       Per HPI, otherwise negative  Cardiovascular:       Per HPI, otherwise negative  Gastrointestinal: Positive for abdominal pain. Negative for vomiting.  Endocrine:       Negative aside from HPI  Genitourinary:       Neg aside from HPI   Musculoskeletal:       Per HPI, otherwise negative  Skin: Negative.   Neurological: Negative for syncope.     Physical Exam Updated Vital  Signs BP 115/82   Pulse 78   Temp 99.1 F (37.3 C)   Resp 14   Ht 4\' 10"  (1.473 m)   Wt 74.8 kg (165 lb)   LMP 09/13/2017 (Exact Date)   SpO2 94%   BMI 34.49 kg/m   Physical Exam  Constitutional: She is oriented to person, place, and time. She appears well-developed and well-nourished. No distress.  HENT:  Head: Normocephalic and atraumatic.  Eyes: Conjunctivae and EOM are normal.  Cardiovascular: Normal rate and regular rhythm.  Pulmonary/Chest: Effort normal and breath sounds normal. No stridor. No respiratory distress.  Abdominal: She exhibits no distension.  Minimal tenderness to palpation in the suprapubic region, no guarding, no rebound, no appreciable mass. No lateral abdominal tenderness anywhere.  Musculoskeletal: She exhibits no edema.  Neurological: She is alert and oriented to person, place, and time. No cranial nerve deficit.  Skin: Skin is warm and dry.  Psychiatric: She has a normal mood and affect.  Nursing note and vitals reviewed.    ED Treatments / Results  Labs (all labs ordered are listed, but only abnormal results are displayed) Labs Reviewed  COMPREHENSIVE METABOLIC PANEL - Abnormal; Notable for the following components:      Result Value   Sodium 134 (*)    Glucose, Bld 157 (*)    All other components within normal limits  CBC - Abnormal; Notable for the following components:   WBC 13.7 (*)    All other components within normal limits  URINALYSIS, ROUTINE W REFLEX MICROSCOPIC - Abnormal; Notable for the following components:   APPearance HAZY (*)    Leukocytes, UA LARGE (*)    Bacteria, UA MANY (*)    Squamous Epithelial / LPF 6-30 (*)    All other components within normal limits  LIPASE, BLOOD  I-STAT BETA HCG BLOOD, ED ([facility], [facility], [facility] ONLY)     Procedures Procedures (including critical care time)  Medications Ordered in ED Medications  ketorolac (TORADOL) 30 MG/ML injection 15 mg (not administered)  sulfamethoxazole-trimethoprim  (BACTRIM DS,SEPTRA DS) 800-160 MG per tablet 1 tablet (not administered)  phenazopyridine (PYRIDIUM) tablet 100 mg (not administered)     Initial Impression / Assessment and Plan / ED Course  I have reviewed the triage vital signs and the nursing notes.  Pertinent labs & imaging results that were available during my care of the patient were reviewed by me and considered in my medical decision making (see chart for details).   Young female presents with several days of suprapubic pain. Patient has a soft, non-peritoneal abdomen, though there is mild tenderness in the suprapubic region.  No guarding, rebound, and with generally reassuring labs, there is no evidence for sepsis, bacteremia. Patient has mild leukocytosis, and given her description of polyuria, as well as leukocytes  in her urine, there is suspicion for urinary tract infection and cystitis. With otherwise reassuring findings, patient was started on a course of antibiotics, discharged in stable condition with primary care follow-up.   Final Clinical Impressions(s) / ED Diagnoses   Final diagnoses:  Lower abdominal pain  Acute cystitis without hematuria    ED Discharge Orders        Ordered    sulfamethoxazole-trimethoprim (BACTRIM DS,SEPTRA DS) 800-160 MG tablet  2 times daily     11/07/17 1900    phenazopyridine (PYRIDIUM) 200 MG tablet  3 times daily     11/07/17 1900       Gerhard MunchLockwood, Shatisha Falter, MD 11/07/17 1904

## 2017-11-07 NOTE — ED Triage Notes (Signed)
Pt endorses missing her period, LMP in the middle of September. Pt began having lower mid abd discomfort with nausea last Saturday. Denies fever, vomiting or diarrhea. Thinks she may be pregnant. VSS. Denies vaginal bleeding or d/c.

## 2017-11-07 NOTE — ED Notes (Signed)
Pt reports lower abd pain x approx 1 week with nausea. Pt reports LMP in September. Denies vaginal bleeding, vaginal discharge, flank pain, dysuria, fever, chills, constipation/diarrhea, or vomiting. Pt A&Ox4.

## 2017-11-07 NOTE — ED Notes (Signed)
Pt wheeled to lobby by family. VSS. All belongings with pt

## 2017-11-07 NOTE — ED Notes (Addendum)
Spoke with EDP regarding pt plan of care. Per Dr. Jeraldine LootsLockwood, will order medications for pt pain.

## 2017-12-19 ENCOUNTER — Encounter (HOSPITAL_COMMUNITY): Payer: Self-pay | Admitting: Family Medicine

## 2017-12-19 ENCOUNTER — Ambulatory Visit (INDEPENDENT_AMBULATORY_CARE_PROVIDER_SITE_OTHER): Payer: Medicaid Other

## 2017-12-19 ENCOUNTER — Ambulatory Visit (HOSPITAL_COMMUNITY)
Admission: EM | Admit: 2017-12-19 | Discharge: 2017-12-19 | Disposition: A | Discharge status: Home / Self Care | Payer: Medicaid Other | Attending: Internal Medicine | Admitting: Internal Medicine

## 2017-12-19 DIAGNOSIS — E669 Obesity, unspecified: Secondary | ICD-10-CM | POA: Insufficient documentation

## 2017-12-19 DIAGNOSIS — N3001 Acute cystitis with hematuria: Secondary | ICD-10-CM | POA: Diagnosis not present

## 2017-12-19 DIAGNOSIS — Z880 Allergy status to penicillin: Secondary | ICD-10-CM | POA: Diagnosis not present

## 2017-12-19 DIAGNOSIS — F17210 Nicotine dependence, cigarettes, uncomplicated: Secondary | ICD-10-CM | POA: Diagnosis not present

## 2017-12-19 DIAGNOSIS — W19XXXA Unspecified fall, initial encounter: Secondary | ICD-10-CM

## 2017-12-19 DIAGNOSIS — F329 Major depressive disorder, single episode, unspecified: Secondary | ICD-10-CM | POA: Diagnosis not present

## 2017-12-19 DIAGNOSIS — I10 Essential (primary) hypertension: Secondary | ICD-10-CM | POA: Insufficient documentation

## 2017-12-19 DIAGNOSIS — S2020XA Contusion of thorax, unspecified, initial encounter: Secondary | ICD-10-CM

## 2017-12-19 DIAGNOSIS — Z794 Long term (current) use of insulin: Secondary | ICD-10-CM | POA: Insufficient documentation

## 2017-12-19 DIAGNOSIS — E119 Type 2 diabetes mellitus without complications: Secondary | ICD-10-CM | POA: Insufficient documentation

## 2017-12-19 DIAGNOSIS — Z79899 Other long term (current) drug therapy: Secondary | ICD-10-CM | POA: Insufficient documentation

## 2017-12-19 DIAGNOSIS — Z9889 Other specified postprocedural states: Secondary | ICD-10-CM | POA: Insufficient documentation

## 2017-12-19 DIAGNOSIS — R109 Unspecified abdominal pain: Secondary | ICD-10-CM | POA: Insufficient documentation

## 2017-12-19 DIAGNOSIS — M545 Low back pain, unspecified: Secondary | ICD-10-CM

## 2017-12-19 LAB — POCT URINALYSIS DIP (DEVICE)
Bilirubin Urine: NEGATIVE
Glucose, UA: NEGATIVE mg/dL
KETONES UR: NEGATIVE mg/dL
Nitrite: NEGATIVE
PH: 6.5 (ref 5.0–8.0)
PROTEIN: 30 mg/dL — AB
Specific Gravity, Urine: 1.025 (ref 1.005–1.030)
Urobilinogen, UA: 0.2 mg/dL (ref 0.0–1.0)

## 2017-12-19 MED ORDER — CEPHALEXIN 500 MG PO CAPS: 500.0000 mg | ORAL_CAPSULE | Freq: Four times a day (QID) | ORAL | 0 refills | Status: DC

## 2017-12-19 MED ORDER — HYDROCODONE-ACETAMINOPHEN 5-325 MG PO TABS
1.0000 | ORAL_TABLET | Freq: Once | ORAL | Status: AC
  Administered 2017-12-19: 1 via ORAL

## 2017-12-19 MED ORDER — KETOROLAC TROMETHAMINE 60 MG/2ML IM SOLN
45.0000 mg | Freq: Once | INTRAMUSCULAR | Status: AC
  Administered 2017-12-19: 45 mg via INTRAMUSCULAR

## 2017-12-19 MED ORDER — HYDROCODONE-ACETAMINOPHEN 5-325 MG PO TABS
ORAL_TABLET | ORAL | Status: AC
  Filled 2017-12-19: qty 1

## 2017-12-19 MED ORDER — KETOROLAC TROMETHAMINE 60 MG/2ML IM SOLN
INTRAMUSCULAR | Status: AC
  Filled 2017-12-19: qty 2

## 2017-12-19 NOTE — ED Triage Notes (Signed)
Pt here for left flank pain that radiates into her back and around to her right side. Reports since 6 pm. Pt had a fall yesterday. Denies any fevers or urinary symptoms.

## 2017-12-19 NOTE — Discharge Instructions (Signed)
Follow the instructions. Ice to sore areas. Ibuprofen. Take the Keflex for urinary tract infection.

## 2017-12-19 NOTE — ED Provider Notes (Signed)
[facility]    CSN: [id number] Arrival date & time: 12/19/17  1814     History   Chief Complaint Chief Complaint  Patient presents with  . Flank Pain    HPI Marilyn Joseph is a 31 y.o. female.   31 year old obese female with type 2 diabetes mellitus states that she fell yesterday and struck her left lateral chest, back and right lower lateral chest. She is complaining of pain primarily to the left lower ribs and along the costal margin and radiates along the lower back to the right lower costal margin. Denies seeing blood. No vomiting. No shortness of breath or cough. No diarrhea. The patient never mentioned urinary symptoms. When the urinalysis came back I asked her if she had any symptoms such as dysuria or frequency and she stated that she did. The symptoms started today.      Past Medical History:  Diagnosis Date  . Depression   . Diabetes mellitus without complication (HCC)   . Hypertension     There are no active problems to display for this patient.   Past Surgical History:  Procedure Laterality Date  . GASTROSTOMY W/ FEEDING TUBE      OB History    No data available       Home Medications    Prior to Admission medications   Medication Sig Start Date End Date Taking? Authorizing Provider  albuterol (PROVENTIL HFA;VENTOLIN HFA) 108 (90 Base) MCG/ACT inhaler Inhale 1 puff into the lungs every 6 (six) hours as needed for wheezing or shortness of breath.    [provider]  budesonide-formoterol (SYMBICORT) 160-4.5 MCG/ACT inhaler Inhale 2 puffs as needed into the lungs.     [provider]  buPROPion (WELLBUTRIN SR) 150 MG 12 hr tablet Take 150 mg 2 (two) times daily by mouth. 09/30/17   [provider]  busPIRone (BUSPAR) 15 MG tablet Take 15 mg 2 (two) times daily by mouth. 10/30/17   [provider]  cephALEXin (KEFLEX) 500 MG capsule Take 1 capsule (500 mg total) by mouth 4 (four) times daily. 12/19/17    Hayden Rasmussen, NP  cetirizine (ZYRTEC) 10 MG tablet Take 10 mg daily by mouth. 09/03/17   [provider]  cyclobenzaprine (FLEXERIL) 10 MG tablet Take 10 mg 2 (two) times daily as needed by mouth for muscle spasms.     [provider]  FLUoxetine (PROZAC) 20 MG capsule Take 40 mg daily by mouth.     [provider]  lamoTRIgine (LAMICTAL) 100 MG tablet Take 100 mg daily by mouth. 10/30/17   [provider]  NOVOLOG MIX 70/30 FLEXPEN (70-30) 100 UNIT/ML FlexPen Inject 25-50 Units See admin instructions into the skin. 50 units in the morning and 25 in the evening 10/30/17   [provider]  phenazopyridine (PYRIDIUM) 200 MG tablet Take 1 tablet (200 mg total) 3 (three) times daily by mouth. 11/07/17   Gerhard Munch, MD  REXULTI 2 MG TABS Take 1 tablet daily by mouth. 10/30/17   [provider]  traZODone (DESYREL) 100 MG tablet Take 100 mg at bedtime by mouth.    [provider]  zolpidem (AMBIEN) 10 MG tablet Take 10 mg at bedtime by mouth. 10/30/17   [provider]    Family History History reviewed. No pertinent family history.  Social History Social History   Tobacco Use  . Smoking status: Current Every Day Smoker    Packs/day: 0.50    Types: Cigarettes  .  Smokeless tobacco: Never Used  Substance Use Topics  . Alcohol use: No  . Drug use: No     Allergies   Clindamycin/lincomycin and Penicillins   Review of Systems Review of Systems  Constitutional: Positive for activity change. Negative for fever.  HENT: Negative.   Respiratory: Negative for cough and shortness of breath.   Cardiovascular: Negative for palpitations and leg swelling.  Gastrointestinal: Negative.  Negative for abdominal pain.  Musculoskeletal: Positive for back pain.  Skin: Negative.   All other systems reviewed and are negative.    Physical Exam Triage Vital Signs ED Triage Vitals [12/19/17 1828]  Enc Vitals Group     BP 107/68      Pulse Rate (!) 103     Resp 18     Temp 98.6 F (37 C)     Temp src      SpO2 96 %     Weight      Height      Head Circumference      Peak Flow      Pain Score      Pain Loc      Pain Edu?      Excl. in GC?    No data found.  Updated Vital Signs BP 107/68   Pulse (!) 103   Temp 98.6 F (37 C)   Resp 18   LMP 12/14/2017 (Exact Date)   SpO2 96%   Visual Acuity Right Eye Distance:   Left Eye Distance:   Bilateral Distance:    Right Eye Near:   Left Eye Near:    Bilateral Near:     Physical Exam  Constitutional: She is oriented to person, place, and time. She appears well-developed and well-nourished. No distress.  HENT:  Head: Normocephalic and atraumatic.  Eyes: EOM are normal. Pupils are equal, round, and reactive to light.  Neck: Normal range of motion. Neck supple.  Cardiovascular: Normal rate and regular rhythm.  Pulmonary/Chest: Effort normal and breath sounds normal. No stridor. No respiratory distress. She has no wheezes. She has no rales.  Musculoskeletal: She exhibits no edema or deformity.  Tenderness along the left lateral lower chest wall and ribs, pain across the lower back with tenderness bilaterally. No spinal tenderness, deformity or swelling. Tenderness to the right lower costal margin.  Lymphadenopathy:    She has no cervical adenopathy.  Neurological: She is alert and oriented to person, place, and time. No cranial nerve deficit.  Skin: Skin is warm and dry.  Nursing note and vitals reviewed.    UC Treatments / Results  Labs (all labs ordered are listed, but only abnormal results are displayed) Labs Reviewed  POCT URINALYSIS DIP (DEVICE) - Abnormal; Notable for the following components:      Result Value   Hgb urine dipstick LARGE (*)    Protein, ur 30 (*)    Leukocytes, UA LARGE (*)    All other components within normal limits    EKG  EKG Interpretation None       Radiology Dg Ribs Unilateral W/chest Left  Result Date:  12/19/2017 CLINICAL DATA:  31 year old female with fall and left chest wall pain. EXAM: LEFT RIBS AND CHEST - 3+ VIEW COMPARISON:  Chest radiograph dated 04/03/2017 FINDINGS: The lungs are clear. There is no pleural effusion or pneumothorax. The cardiac silhouette is within normal limits. No acute osseous pathology. No rib fractures noted. IMPRESSION: Negative. Electronically Signed   By: Elgie CollardArash  Radparvar M.D.   On: 12/19/2017  19:33    Procedures Procedures (including critical care time)  Medications Ordered in UC Medications  ketorolac (TORADOL) injection 45 mg (45 mg Intramuscular Given 12/19/17 1855)  HYDROcodone-acetaminophen (NORCO/VICODIN) 5-325 MG per tablet 1 tablet (1 tablet Oral Given 12/19/17 1855)     Initial Impression / Assessment and Plan / UC Course  I have reviewed the triage vital signs and the nursing notes.  Pertinent labs & imaging results that were available during my care of the patient were reviewed by me and considered in my medical decision making (see chart for details).    Follow the instructions. Ice to sore areas. Ibuprofen. Take the Keflex for urinary tract infection.    Final Clinical Impressions(s) / UC Diagnoses   Final diagnoses:  Fall, initial encounter  Acute bilateral low back pain without sciatica  Contusion of thoracic wall, unspecified area of thoracic wall, initial encounter  Acute cystitis with hematuria    ED Discharge Orders        Ordered    cephALEXin (KEFLEX) 500 MG capsule  4 times daily     12/19/17 1944       Controlled Substance Prescriptions [place] Controlled Substance Registry consulted? Not Applicable   Hayden RasmussenMabe, Gerrianne Aydelott, NP 12/19/17 1946

## 2017-12-22 ENCOUNTER — Other Ambulatory Visit: Payer: Self-pay

## 2017-12-22 ENCOUNTER — Emergency Department (HOSPITAL_COMMUNITY)
Admission: EM | Admit: 2017-12-22 | Discharge: 2017-12-22 | Disposition: A | Discharge status: Home / Self Care | Payer: Medicaid Other | Attending: Emergency Medicine | Admitting: Emergency Medicine

## 2017-12-22 ENCOUNTER — Encounter (HOSPITAL_COMMUNITY): Payer: Self-pay

## 2017-12-22 DIAGNOSIS — F17210 Nicotine dependence, cigarettes, uncomplicated: Secondary | ICD-10-CM | POA: Insufficient documentation

## 2017-12-22 DIAGNOSIS — E119 Type 2 diabetes mellitus without complications: Secondary | ICD-10-CM | POA: Diagnosis not present

## 2017-12-22 DIAGNOSIS — Z79899 Other long term (current) drug therapy: Secondary | ICD-10-CM | POA: Insufficient documentation

## 2017-12-22 DIAGNOSIS — I10 Essential (primary) hypertension: Secondary | ICD-10-CM | POA: Insufficient documentation

## 2017-12-22 DIAGNOSIS — N390 Urinary tract infection, site not specified: Secondary | ICD-10-CM | POA: Insufficient documentation

## 2017-12-22 DIAGNOSIS — Z794 Long term (current) use of insulin: Secondary | ICD-10-CM | POA: Diagnosis not present

## 2017-12-22 DIAGNOSIS — R109 Unspecified abdominal pain: Secondary | ICD-10-CM

## 2017-12-22 DIAGNOSIS — R1030 Lower abdominal pain, unspecified: Secondary | ICD-10-CM | POA: Diagnosis present

## 2017-12-22 LAB — URINALYSIS, ROUTINE W REFLEX MICROSCOPIC
BILIRUBIN URINE: NEGATIVE
Glucose, UA: 50 mg/dL — AB
HGB URINE DIPSTICK: NEGATIVE
Ketones, ur: NEGATIVE mg/dL
Leukocytes, UA: NEGATIVE
Nitrite: NEGATIVE
Protein, ur: NEGATIVE mg/dL
SPECIFIC GRAVITY, URINE: 1.005 (ref 1.005–1.030)
pH: 6 (ref 5.0–8.0)

## 2017-12-22 LAB — URINE CULTURE

## 2017-12-22 LAB — I-STAT BETA HCG BLOOD, ED (MC, WL, AP ONLY)

## 2017-12-22 MED ORDER — CEPHALEXIN 500 MG PO CAPS: ORAL_CAPSULE | ORAL | 0 refills | Status: DC

## 2017-12-22 NOTE — ED Provider Notes (Signed)
[facility]EMERGENCY DEPT Provider Note   CSN: [id number] Arrival date & time: 12/22/17  1014     History   Chief Complaint Chief Complaint  Patient presents with  . Flank Pain    HPI Marilyn Joseph is a 31 y.o. female with a PMHx of DM2 and HTN, who presents to the ED with complaints of intermittent b/l flank pain x4 days. Chart review reveals that [facility] flank pain after a fall; had a U/A done which showed large leuks and hgb, treated for UTI, sent home with keflex; subsequent UCx showed >100,000 CFU of E.coli which were sensitive to cephalosporins.  Patient states that she never received the antibiotic prescription, so she has not been taking it.  She describes her pain as 8/10 intermittent sharp left flank pain that radiates over to the right flank as well as her suprapubic area, worse with laying on her sides, and unrelieved with ibuprofen.  She reports associated increased urinary frequency and urgency.  She is sexually active with one female partner, unprotected.  LMP 12/13/17.  She denies any history of kidney stones. She denies fevers, chills, CP, SOB, nausea/vomiting, diarrhea/constipation, obstipation, hematuria, dysuria, malodorous urine, vaginal bleeding/discharge, myalgias, arthralgias, numbness, tingling, focal weakness, or any other complaints at this time. Denies recent travel, sick contacts, suspicious food intake, EtOH use, or frequent NSAID use.   The history is provided by the patient and medical records. No language interpreter was used.  Flank Pain  This is a new problem. The current episode started more than 2 days ago. The problem occurs daily. The problem has not changed since onset.Associated symptoms include abdominal pain (suprapubic, from flanks). Pertinent negatives include no chest pain and no shortness of breath. Exacerbated by: laying on her sides. Nothing relieves the symptoms. Treatments tried: ibuprofen.  The treatment provided no relief.    Past Medical History:  Diagnosis Date  . Depression   . Diabetes mellitus without complication (HCC)   . Hypertension     There are no active problems to display for this patient.   Past Surgical History:  Procedure Laterality Date  . GASTROSTOMY W/ FEEDING TUBE      OB History    No data available       Home Medications    Prior to Admission medications   Medication Sig Start Date End Date Taking? Authorizing Provider  albuterol (PROVENTIL HFA;VENTOLIN HFA) 108 (90 Base) MCG/ACT inhaler Inhale 1 puff into the lungs every 6 (six) hours as needed for wheezing or shortness of breath.    [provider]  budesonide-formoterol (SYMBICORT) 160-4.5 MCG/ACT inhaler Inhale 2 puffs as needed into the lungs.     [provider]  buPROPion (WELLBUTRIN SR) 150 MG 12 hr tablet Take 150 mg 2 (two) times daily by mouth. 09/30/17   [provider]  busPIRone (BUSPAR) 15 MG tablet Take 15 mg 2 (two) times daily by mouth. 10/30/17   [provider]  cephALEXin (KEFLEX) 500 MG capsule Take 1 capsule (500 mg total) by mouth 4 (four) times daily. 12/19/17   Hayden RasmussenMabe, David, NP  cetirizine (ZYRTEC) 10 MG tablet Take 10 mg daily by mouth. 09/03/17   [provider]  cyclobenzaprine (FLEXERIL) 10 MG tablet Take 10 mg 2 (two) times daily as needed by mouth for muscle spasms.     [provider]  FLUoxetine (PROZAC) 20 MG capsule Take 40 mg daily by mouth.  [provider]  lamoTRIgine (LAMICTAL) 100 MG tablet Take 100 mg daily by mouth. 10/30/17   [provider]  NOVOLOG MIX 70/30 FLEXPEN (70-30) 100 UNIT/ML FlexPen Inject 25-50 Units See admin instructions into the skin. 50 units in the morning and 25 in the evening 10/30/17   [provider]  phenazopyridine (PYRIDIUM) 200 MG tablet Take 1 tablet (200 mg total) 3 (three) times daily by mouth. 11/07/17   Gerhard MunchLockwood, Robert, MD  REXULTI 2 MG  TABS Take 1 tablet daily by mouth. 10/30/17   [provider]  traZODone (DESYREL) 100 MG tablet Take 100 mg at bedtime by mouth.    [provider]  zolpidem (AMBIEN) 10 MG tablet Take 10 mg at bedtime by mouth. 10/30/17   [provider]    Family History No family history on file.  Social History Social History   Tobacco Use  . Smoking status: Current Every Day Smoker    Packs/day: 0.50    Types: Cigarettes  . Smokeless tobacco: Never Used  Substance Use Topics  . Alcohol use: No  . Drug use: No     Allergies   Clindamycin/lincomycin and Penicillins   Review of Systems Review of Systems  Constitutional: Negative for chills and fever.  Respiratory: Negative for shortness of breath.   Cardiovascular: Negative for chest pain.  Gastrointestinal: Positive for abdominal pain (suprapubic, from flanks). Negative for constipation, diarrhea, nausea and vomiting.  Genitourinary: Positive for flank pain, frequency and urgency. Negative for dysuria, hematuria, vaginal bleeding and vaginal discharge.       No malodorous urine  Musculoskeletal: Negative for arthralgias and myalgias.  Skin: Negative for color change.  Allergic/Immunologic: Positive for immunocompromised state (DM2).  Neurological: Negative for weakness and numbness.  Psychiatric/Behavioral: Negative for confusion.   All other systems reviewed and are negative for acute change except as noted in the HPI.    Physical Exam Updated Vital Signs BP 117/79 (BP Location: Left Arm)   Pulse 90   Temp 97.8 F (36.6 C) (Oral)   Resp 16   LMP 12/14/2017 (Exact Date)   SpO2 97%   Physical Exam  Constitutional: She is oriented to person, place, and time. Vital signs are normal. She appears well-developed and well-nourished.  Non-toxic appearance. No distress.  Afebrile, nontoxic, NAD  HENT:  Head: Normocephalic and atraumatic.  Mouth/Throat: Oropharynx is clear and moist and mucous membranes are  normal.  Eyes: Conjunctivae and EOM are normal. Right eye exhibits no discharge. Left eye exhibits no discharge.  Neck: Normal range of motion. Neck supple.  Cardiovascular: Normal rate, regular rhythm, normal heart sounds and intact distal pulses. Exam reveals no gallop and no friction rub.  No murmur heard. Pulmonary/Chest: Effort normal and breath sounds normal. No respiratory distress. She has no decreased breath sounds. She has no wheezes. She has no rhonchi. She has no rales.  Abdominal: Soft. Normal appearance and bowel sounds are normal. She exhibits no distension. There is tenderness in the suprapubic area. There is no rigidity, no rebound, no guarding, no CVA tenderness, no tenderness at McBurney's point and negative Murphy's sign.  Soft, obese but not obviously distended, +BS throughout, with very mild suprapubic TTP, no r/g/r, neg murphy's, neg mcburney's, no CVA TTP   Musculoskeletal: Normal range of motion.  Neurological: She is alert and oriented to person, place, and time. She has normal strength. No sensory deficit.  Skin: Skin is warm, dry and intact. No rash noted.  Psychiatric: She has a  normal mood and affect.  Nursing note and vitals reviewed.    ED Treatments / Results  Labs (all labs ordered are listed, but only abnormal results are displayed) Labs Reviewed  URINALYSIS, ROUTINE W REFLEX MICROSCOPIC - Abnormal; Notable for the following components:      Result Value   Color, Urine STRAW (*)    Glucose, UA 50 (*)    All other components within normal limits  I-STAT BETA HCG BLOOD, ED ([facility], [facility], [facility] ONLY)    Urine culture 12/19/17: Specimen Information: Urine, Clean Catch     Component 3d ago  Specimen Description URINE, CLEAN CATCH   Special Requests Immunocompromised   Culture Abnormal  >=100,000 COLONIES/mL ESCHERICHIA COLI  20,000 COLONIES/mL GROUP B STREP(S.AGALACTIAE)ISOLATED  TESTING AGAINST S. AGALACTIAE NOT ROUTINELY PERFORMED DUE TO PREDICTABILITY OF  AMP/PEN/VAN SUSCEPTIBILITY.      Report Status 12/22/2017 FINAL   Organism ID, Bacteria ESCHERICHIA COLI Abnormal    Resulting Agency [facility]  Susceptibility    Escherichia coli    MIC    AMPICILLIN >=32 RESIST... Resistant    AMPICILLIN/SULBACTAM 16 INTERMED... Intermediate    CEFAZOLIN <=4 SENSITIVE  Sensitive    CEFTRIAXONE <=1 SENSITIVE  Sensitive    CIPROFLOXACIN >=4 RESISTANT  Resistant    Extended ESBL NEGATIVE  Sensitive    GENTAMICIN >=16 RESIST... Resistant    IMIPENEM <=0.25 SENS... Sensitive    NITROFURANTOIN <=16 SENSIT... Sensitive    PIP/TAZO <=4 SENSITIVE  Sensitive    TRIMETH/SULFA >=320 RESIS... Resistant         Susceptibility Comments   Escherichia coli  >=100,000 COLONIES/mL ESCHERICHIA COLI      Specimen Collected: 12/19/17 19:52         EKG  EKG Interpretation None       Radiology No results found.  Procedures Procedures (including critical care time)  Medications Ordered in ED Medications - No data to display   Initial Impression / Assessment and Plan / ED Course  I have reviewed the triage vital signs and the nursing notes.  Pertinent labs & imaging results that were available during my care of the patient were reviewed by me and considered in my medical decision making (see chart for details).     31 y.o. female here with intermittent b/l flank pain x4 days radiating to suprapubic area. Was recently seen for similar complaints at [facility] 4 days ago, was discharged with keflex rx for UTI but states she never received it so she hasn't been started on it. UCx resulted with E.coli which would be sensitive to keflex. She reports increased urinary frequency and urgency. Denies vaginal complaints. On exam, very mild suprapubic abd TTP, nonperitoneal, no CVA TTP, afebrile and nontoxic. BetaHCG neg. U/A collected per pt, but hasn't been put in process in computer. Will inquire with nursing as to what's going on with this sample; if can't be  found, will need new sample. Will hold off on other labs for now, doubt need for imaging or pelvic exam. Will reassess shortly.   4:07 PM U/A today unremarkable, however considering that she had a U/A 3 days ago that was c/w UTI, and UCx confirmed E.coli growth, I suspect that UTI is the source of her symptoms. Will send home with keflex rx, advised adequate hydration, tylenol/motrin for pain, and f/up with PCP in 1wk for recheck. I explained the diagnosis and have given explicit precautions to return to the ER including for any other new or worsening symptoms. The patient  understands and accepts the medical plan as it's been dictated and I have answered their questions. Discharge instructions concerning home care and prescriptions have been given. The patient is STABLE and is discharged to home in good condition.    Final Clinical Impressions(s) / ED Diagnoses   Final diagnoses:  Flank pain  Lower urinary tract infectious disease    ED Discharge Orders        Ordered    cephALEXin (KEFLEX) 500 MG capsule     12/22/17 [address] [place], [place] 12/22/17 1607    Tilden Fossa, MD 12/23/17 [phone]

## 2017-12-22 NOTE — Discharge Instructions (Signed)
Stay very well hydrated with plenty of water throughout the day. Take antibiotic until completed. Alternate between tylenol and motrin as needed for pain. Follow up with your primary care physician in 1 week for recheck of ongoing symptoms but return to ER for emergent changing or worsening of symptoms. °Please seek immediate care if you develop the following: °You develop back pain.  °Your symptoms are no better, or worse in 3 days. °There is severe back pain or lower abdominal pain.  °You develop chills.  °You have a fever.  °There is nausea or vomiting.  °There is continued burning or discomfort with urination.  °

## 2017-12-22 NOTE — ED Notes (Signed)
Pt requesting an update.  RN left a note for the PA.  Pt states "I need to catch the bus before it stops running."

## 2017-12-22 NOTE — ED Triage Notes (Signed)
She c/o right flank pain x 4 days. She is in no distress.

## 2018-01-21 ENCOUNTER — Encounter (HOSPITAL_COMMUNITY): Payer: Self-pay | Admitting: Emergency Medicine

## 2018-01-21 ENCOUNTER — Ambulatory Visit (HOSPITAL_COMMUNITY)
Admission: EM | Admit: 2018-01-21 | Discharge: 2018-01-21 | Disposition: A | Discharge status: Home / Self Care | Payer: Medicaid Other | Attending: Family Medicine | Admitting: Family Medicine

## 2018-01-21 DIAGNOSIS — R0981 Nasal congestion: Secondary | ICD-10-CM

## 2018-01-21 DIAGNOSIS — R69 Illness, unspecified: Secondary | ICD-10-CM

## 2018-01-21 DIAGNOSIS — M791 Myalgia, unspecified site: Secondary | ICD-10-CM | POA: Diagnosis not present

## 2018-01-21 DIAGNOSIS — J111 Influenza due to unidentified influenza virus with other respiratory manifestations: Secondary | ICD-10-CM

## 2018-01-21 DIAGNOSIS — R05 Cough: Secondary | ICD-10-CM | POA: Diagnosis not present

## 2018-01-21 NOTE — ED Provider Notes (Signed)
  [facility]   [id number] 01/21/18 Arrival Time: 1125  ASSESSMENT & PLAN:  1. Influenza-like illness    Discussed typical duration of symptoms. OTC symptom care as needed. Ensure adequate fluid intake and rest. May f/u with PCP or here as needed.  Reviewed expectations re: course of current medical issues. Questions answered. Outlined signs and symptoms indicating need for more acute intervention. Patient verbalized understanding. After Visit Summary given.   SUBJECTIVE: History from: patient.  Marilyn Joseph is a 32 y.o. female who presents with complaint of nasal congestion, post-nasal drainage, and a persistent dry cough. Onset abrupt, approximately 3-4 days ago. Overall fatigued with body aches. SOB: none. Wheezing: mild; relief with albuterol inhaler. Fever: yes, subjective. Overall normal PO intake without n/v. Sick contacts: no. OTC treatment: Theraflu with mild help.  Social History   Tobacco Use  Smoking Status Current Every Day Smoker  . Packs/day: 0.50  . Types: Cigarettes  Smokeless Tobacco Never Used    ROS: As per HPI.   OBJECTIVE:  Vitals:   01/21/18 1219  BP: 117/79  Pulse: (!) 105  Resp: 20  Temp: 98.2 F (36.8 C)  TempSrc: Oral  SpO2: 98%     General appearance: alert; appears fatigued HEENT: nasal congestion; clear runny nose; throat irritation secondary to post-nasal drainage Neck: supple without LAD Lungs: unlabored respirations, symmetrical air entry with mild exp wheezes that clear with coughing; cough: mild; no respiratory distress Skin: warm and dry Psychological: alert and cooperative; normal mood and affect   Allergies  Allergen Reactions  . Clindamycin/Lincomycin Rash  . Penicillins     Rash  Has patient had a PCN reaction causing immediate rash, facial/tongue/throat swelling, SOB or lightheadedness with hypotension:Yes Has patient had a PCN reaction causing severe rash involving mucus membranes or skin necrosis:  UNKNOWN Has patient had a PCN reaction that required hospitalization NO Has patient had a PCN reaction occurring within the last 10 years: yes If all of the above answers are "NO", then may proceed with Cephalosporin use.     Past Medical History:  Diagnosis Date  . Depression   . Diabetes mellitus without complication (HCC)   . Hypertension    History reviewed. No pertinent family history. Social History   Socioeconomic History  . Marital status: Single    Spouse name: Not on file  . Number of children: Not on file  . Years of education: Not on file  . Highest education level: Not on file  Social Needs  . Financial resource strain: Not on file  . Food insecurity - worry: Not on file  . Food insecurity - inability: Not on file  . Transportation needs - medical: Not on file  . Transportation needs - non-medical: Not on file  Occupational History  . Not on file  Tobacco Use  . Smoking status: Current Every Day Smoker    Packs/day: 0.50    Types: Cigarettes  . Smokeless tobacco: Never Used  Substance and Sexual Activity  . Alcohol use: No  . Drug use: No  . Sexual activity: Not on file  Other Topics Concern  . Not on file  Social History Narrative  . Not on file           Mardella LaymanHagler, Gearldene Fiorenza, MD 01/21/18 1244

## 2018-01-21 NOTE — ED Triage Notes (Signed)
PT C/O: cold sx onset 2 days associated w/prod cough, sneezing, HA, chills,   DENIES: fevers  TAKING MEDS: OTC cold   A&O x4... NAD... Ambulatory

## 2018-01-21 NOTE — Discharge Instructions (Signed)

## 2018-08-04 ENCOUNTER — Other Ambulatory Visit: Payer: Self-pay

## 2018-08-04 ENCOUNTER — Ambulatory Visit (HOSPITAL_COMMUNITY)
Admission: EM | Admit: 2018-08-04 | Discharge: 2018-08-04 | Disposition: A | Discharge status: Home / Self Care | Payer: Medicaid Other | Attending: Family Medicine | Admitting: Family Medicine

## 2018-08-04 ENCOUNTER — Encounter (HOSPITAL_COMMUNITY): Payer: Self-pay

## 2018-08-04 DIAGNOSIS — Z3202 Encounter for pregnancy test, result negative: Secondary | ICD-10-CM | POA: Diagnosis not present

## 2018-08-04 DIAGNOSIS — R6883 Chills (without fever): Secondary | ICD-10-CM | POA: Diagnosis not present

## 2018-08-04 DIAGNOSIS — M791 Myalgia, unspecified site: Secondary | ICD-10-CM

## 2018-08-04 DIAGNOSIS — R6889 Other general symptoms and signs: Secondary | ICD-10-CM

## 2018-08-04 DIAGNOSIS — E1165 Type 2 diabetes mellitus with hyperglycemia: Secondary | ICD-10-CM

## 2018-08-04 LAB — POCT I-STAT, CHEM 8
BUN: 4 mg/dL — ABNORMAL LOW (ref 6–20)
Calcium, Ion: 1.24 mmol/L (ref 1.15–1.40)
Chloride: 97 mmol/L — ABNORMAL LOW (ref 98–111)
Creatinine, Ser: 0.6 mg/dL (ref 0.44–1.00)
GLUCOSE: 321 mg/dL — AB (ref 70–99)
HCT: 41 % (ref 36.0–46.0)
HEMOGLOBIN: 13.9 g/dL (ref 12.0–15.0)
Potassium: 3.5 mmol/L (ref 3.5–5.1)
Sodium: 134 mmol/L — ABNORMAL LOW (ref 135–145)
TCO2: 23 mmol/L (ref 22–32)

## 2018-08-04 LAB — POCT URINALYSIS DIP (DEVICE)
Bilirubin Urine: NEGATIVE
Glucose, UA: 100 mg/dL — AB
HGB URINE DIPSTICK: NEGATIVE
NITRITE: NEGATIVE
PH: 6 (ref 5.0–8.0)
PROTEIN: NEGATIVE mg/dL
Specific Gravity, Urine: 1.015 (ref 1.005–1.030)
UROBILINOGEN UA: 0.2 mg/dL (ref 0.0–1.0)

## 2018-08-04 LAB — POCT PREGNANCY, URINE: PREG TEST UR: NEGATIVE

## 2018-08-04 MED ORDER — IBUPROFEN 800 MG PO TABS: 800.0000 mg | ORAL_TABLET | Freq: Three times a day (TID) | ORAL | 0 refills | Status: DC

## 2018-08-04 NOTE — ED Provider Notes (Signed)
[facility]    CSN: [id number] Arrival date & time: 08/04/18  1936     History   Chief Complaint Chief Complaint  Patient presents with  . Chills    HPI Marilyn Joseph is a 32 y.o. female.   HPI  Patient is here with multiple vague complaints.  Body aches and chills.  No fever.  No diaphoresis or sweats.  She states that "food goes right through me".  No known food intolerance.  No known food allergies.  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  When asked her about diarrhea she corrects.  She does not have loose bowels or diarrhea, she just has bowel movements after every meal.  No blood or mucus in the meal.  No history of colitis.  No nausea or vomiting.  She is a noncompliant insulin-dependent diabetic.  She sits in the office drinking ginger ale.  She tells me she has not used her insulin today.  She has no signs of infection such as runny nose, sore throat, cough, dysuria, frequency.  No known exposure to illness.  Past Medical History:  Diagnosis Date  . Depression   . Diabetes mellitus without complication (HCC)   . Hypertension     There are no active problems to display for this patient.   Past Surgical History:  Procedure Laterality Date  . GASTROSTOMY W/ FEEDING TUBE      OB History   None      Home Medications    Prior to Admission medications   Medication Sig Start Date End Date Taking? Authorizing Provider  albuterol (PROVENTIL HFA;VENTOLIN HFA) 108 (90 Base) MCG/ACT inhaler Inhale 1 puff into the lungs every 6 (six) hours as needed for wheezing or shortness of breath.    [provider]  budesonide-formoterol (SYMBICORT) 160-4.5 MCG/ACT inhaler Inhale 2 puffs as needed into the lungs.     [provider]  buPROPion (WELLBUTRIN SR) 150 MG 12 hr tablet Take 150 mg 2 (two) times daily by mouth. 09/30/17   [provider]  busPIRone (BUSPAR) 15 MG tablet Take 15 mg 2 (two) times daily by mouth. 10/30/17    [provider]  FOLIC ACID PO Take 1 tablet by mouth daily.    [provider]  hydrOXYzine (ATARAX/VISTARIL) 50 MG tablet TAKE 1/2 TO 1 TABLET BY MOUTH 3 TIMES A DAY AS NEEDED FOR ANXIETY 12/01/17   [provider]  ibuprofen (ADVIL,MOTRIN) 800 MG tablet Take 1 tablet (800 mg total) by mouth 3 (three) times daily. 08/04/18   Eustace MooreNelson, Jamar Casagrande Sue, MD  lamoTRIgine (LAMICTAL) 100 MG tablet Take 100 mg daily by mouth. 10/30/17   [provider]  LANTUS SOLOSTAR 100 UNIT/ML Solostar Pen INJECT  40 UNITS SUBCUTANEOUSLY IN THE EVENING 11/19/17   [provider]  NOVOLOG FLEXPEN 100 UNIT/ML FlexPen IF BLOOD SUGAR IS 100-149  2 UNITS, 150-199 4 UNITS, 200-249 6 UNITS, 250-299  8 UNITS, 300-349 10 UNITS,350-399 12 UNITS ,200-449 12 UNITS 11/24/17   [provider]  REXULTI 2 MG TABS Take 1 tablet daily by mouth. 10/30/17   [provider]  traZODone (DESYREL) 100 MG tablet Take 100 mg at bedtime by mouth.    [provider]  zolpidem (AMBIEN) 10 MG tablet Take 10 mg at bedtime by mouth. 10/30/17   [provider]    Family History History reviewed. No pertinent family history.  Social History Social History   Tobacco Use  .  Smoking status: Current Every Day Smoker    Packs/day: 0.50    Types: Cigarettes  . Smokeless tobacco: Never Used  Substance Use Topics  . Alcohol use: No  . Drug use: No     Allergies   Clindamycin/lincomycin and Penicillins   Review of Systems Review of Systems  Constitutional: Positive for chills. Negative for fatigue and fever.  HENT: Negative for congestion, ear pain and sore throat.        Edentulous  Eyes: Negative for pain and visual disturbance.  Respiratory: Negative for cough and shortness of breath.   Cardiovascular: Negative for chest pain and palpitations.  Gastrointestinal: Negative for abdominal pain and vomiting.  Genitourinary: Negative for dysuria and hematuria.    Musculoskeletal: Positive for myalgias. Negative for arthralgias and back pain.  Skin: Negative for color change and rash.  Neurological: Negative for seizures and syncope.  Hematological: Negative for adenopathy. Does not bruise/bleed easily.  Psychiatric/Behavioral: The patient is nervous/anxious.        "Stressed"  All other systems reviewed and are negative.    Physical Exam Triage Vital Signs ED Triage Vitals  Enc Vitals Group     BP 08/04/18 1959 120/83     Pulse Rate 08/04/18 1959 (!) 101     Resp 08/04/18 1959 18     Temp 08/04/18 1959 98.1 F (36.7 C)     Temp Source 08/04/18 1959 Oral     SpO2 08/04/18 1959 100 %     Weight 08/04/18 2009 151 lb (68.5 kg)     Height --      Head Circumference --      Peak Flow --      Pain Score 08/04/18 2008 8     Pain Loc --      Pain Edu? --      Excl. in GC? --    No data found.  Updated Vital Signs BP 120/83 (BP Location: Right Arm)   Pulse (!) 101   Temp 98.1 F (36.7 C) (Oral)   Resp 18   Wt 151 lb (68.5 kg)   LMP 07/14/2018   SpO2 100%   BMI 31.56 kg/m    Visual Acuity Right Eye Distance:   Left Eye Distance:   Bilateral Distance:    Right Eye Near:   Left Eye Near:    Bilateral Near:     Physical Exam  Constitutional: She appears well-developed and well-nourished. No distress.  No distress.  Appears older than stated age.  HENT:  Head: Normocephalic and atraumatic.  Right Ear: External ear normal.  Left Ear: External ear normal.  Mouth/Throat: Oropharynx is clear and moist.  Edentulous  Eyes: Pupils are equal, round, and reactive to light. Conjunctivae are normal.  Neck: Normal range of motion.  Cardiovascular: Normal rate, regular rhythm and normal heart sounds.  Pulmonary/Chest: Effort normal and breath sounds normal. No respiratory distress.  Abdominal: Soft. Bowel sounds are normal. She exhibits no distension.  Nontender.  No organomegaly.  Normal bowel sounds  Musculoskeletal: Normal range  of motion. She exhibits no edema.  Lymphadenopathy:    She has no cervical adenopathy.  Neurological: She is alert.  Skin: Skin is warm and dry.     UC Treatments / Results  Labs (all labs ordered are listed, but only abnormal results are displayed) Labs Reviewed  POCT URINALYSIS DIP (DEVICE) - Abnormal; Notable for the following components:      Result Value   Glucose, UA 100 (*)  Ketones, ur TRACE (*)    Leukocytes, UA TRACE (*)    All other components within normal limits  POCT I-STAT, CHEM 8 - Abnormal; Notable for the following components:   Sodium 134 (*)    Chloride 97 (*)    BUN 4 (*)    Glucose, Bld 321 (*)    All other components within normal limits  POCT PREGNANCY, URINE    EKG None  Radiology No results found.  Procedures Procedures (including critical care time)  Medications Ordered in UC Medications - No data to display  Initial Impression / Assessment and Plan / UC Course  I have reviewed the triage vital signs and the nursing notes.  Pertinent labs & imaging results that were available during my care of the patient were reviewed by me and considered in my medical decision making (see chart for details).     Discussed with patient that it was not appropriate to be drinking soda if she is an insulin-dependent diabetic.  Her sugars out of control.  She is not at a dangerous level, do not suspect ketoacidosis, but she needs to drink water go home take her insulin and follow her sugars through the evening.  I am giving her ibuprofen for her body aches.  Her examination is otherwise negative for any serious infection.  I believe ibuprofen is safe short-term given her normal kidney function.Is to follow-up with her primary care doctor. Final Clinical Impressions(s) / UC Diagnoses   Final diagnoses:  Flu-like symptoms     Discharge Instructions     Drink plenty of water Your examination is reassuring Your sugar is 321 Stop drinking soda and take  insulin as you are prescribed Rest a couple of days Return if worse    ED Prescriptions    Medication Sig Dispense Auth. Provider   ibuprofen (ADVIL,MOTRIN) 800 MG tablet Take 1 tablet (800 mg total) by mouth 3 (three) times daily. 21 tablet Eustace Moore, MD     Controlled Substance Prescriptions  Controlled Substance Registry consulted? Not Applicable  Eustace Moore, MD 08/04/18 2049

## 2018-08-04 NOTE — Discharge Instructions (Signed)
Drink plenty of water Your examination is reassuring Your sugar is 321 Stop drinking soda and take insulin as you are prescribed Rest a couple of days Return if worse

## 2018-08-04 NOTE — ED Triage Notes (Signed)
Body aches chills this started yesterday.

## 2019-04-06 IMAGING — DX DG RIBS W/ CHEST 3+V*L*
4 series · 4 of 4 positions shown · non-contrast
Comparison: Chest radiograph dated 04/03/2017

CLINICAL DATA: 31-year-old female with fall and left chest wall
pain.

EXAM:
LEFT RIBS AND CHEST - 3+ VIEW

[chest pa]
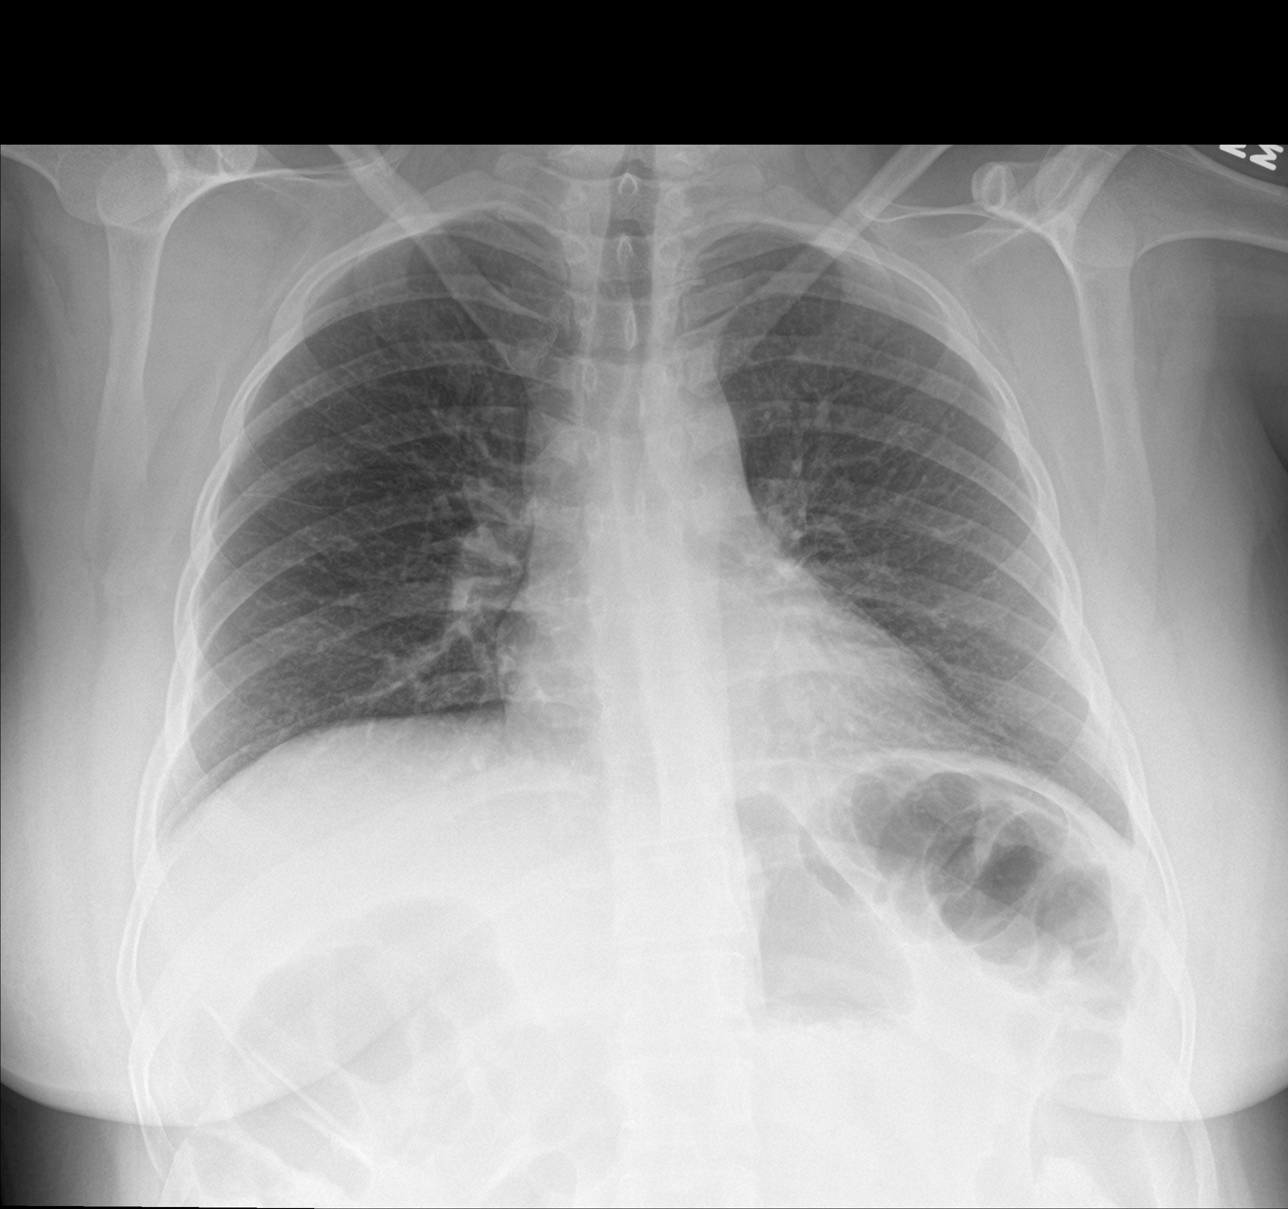

[rib obl]
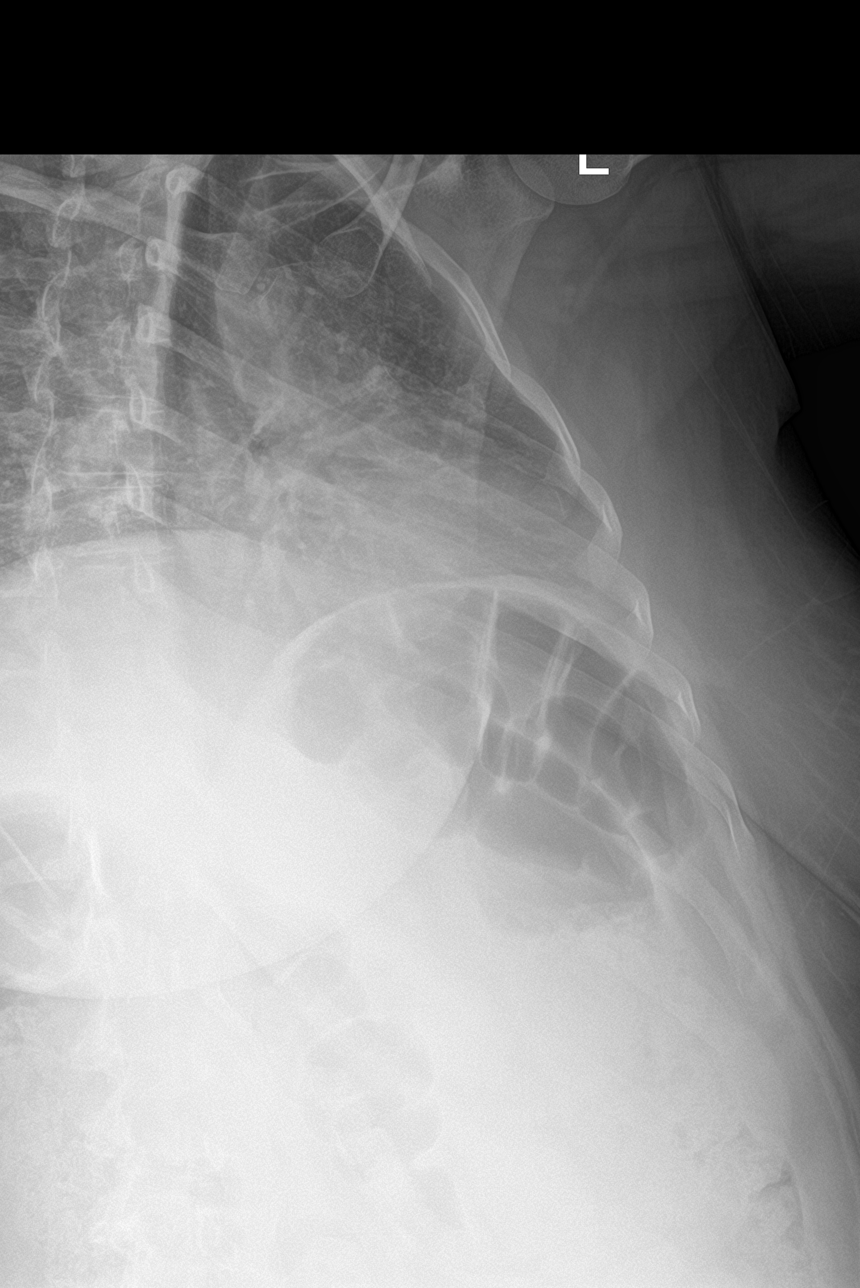

[rib pa (1 of 2)]
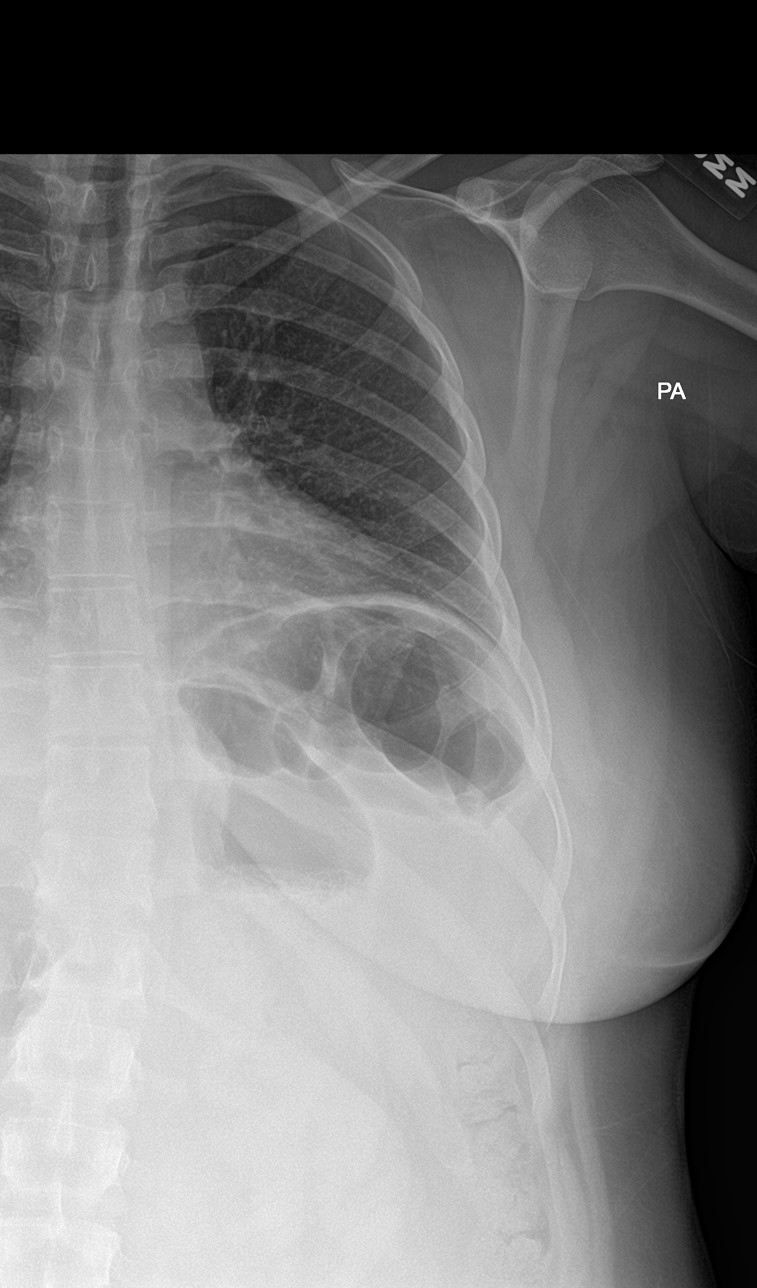

[rib pa (2 of 2)]
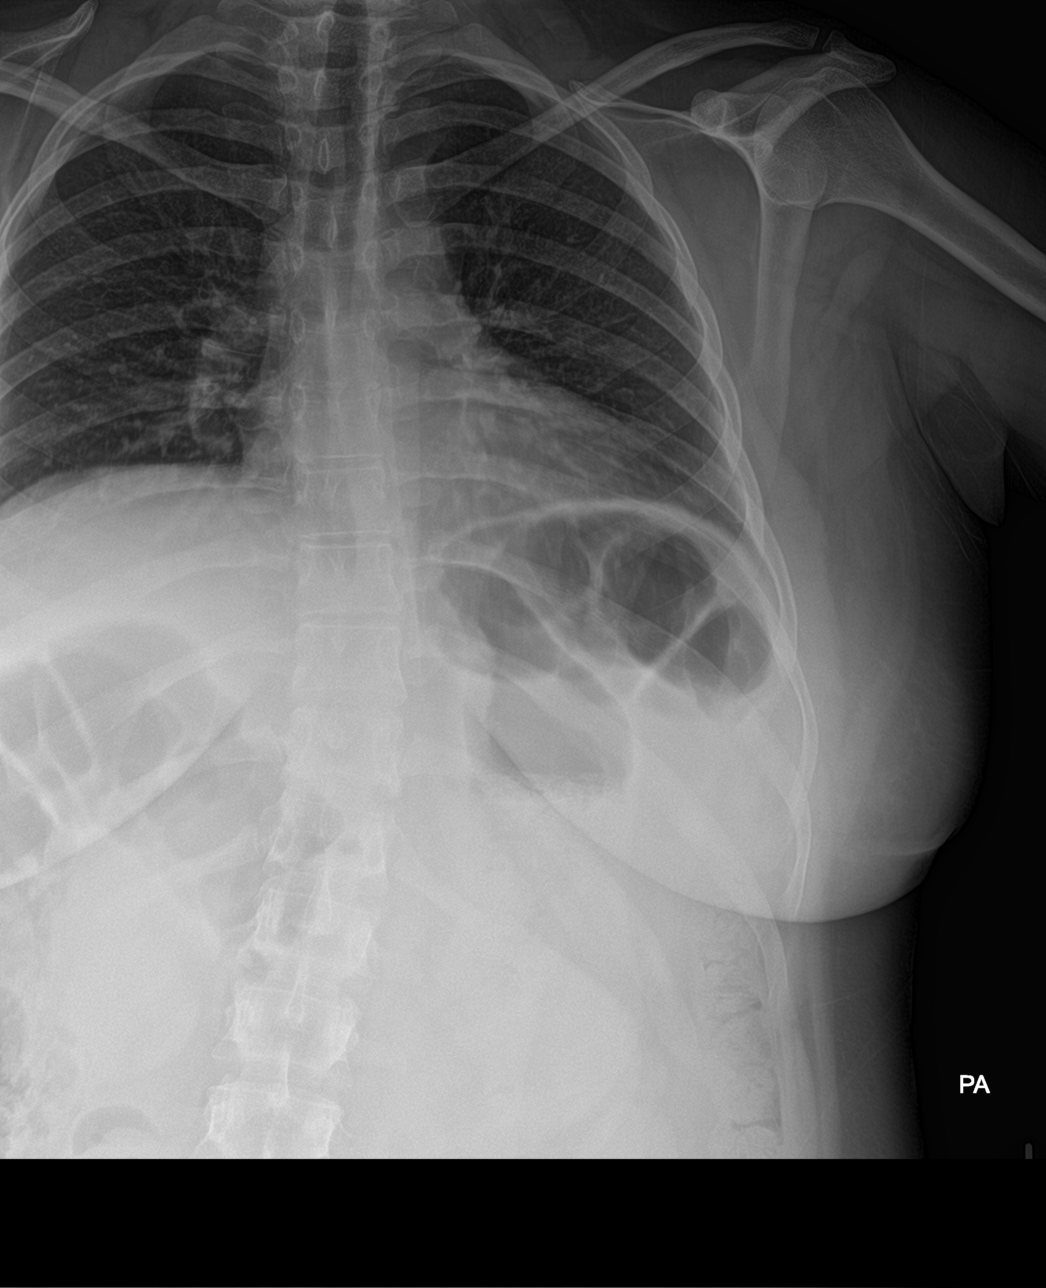

[4 of 4 positions shown; findings below may reference images not displayed]

FINDINGS: The lungs are clear. There is no pleural effusion or pneumothorax.
The cardiac silhouette is within normal limits. No acute osseous
pathology. No rib fractures noted.
IMPRESSION: Negative.

## 2020-02-23 ENCOUNTER — Other Ambulatory Visit: Payer: Self-pay

## 2020-02-23 ENCOUNTER — Encounter (HOSPITAL_COMMUNITY): Payer: Self-pay | Admitting: Emergency Medicine

## 2020-02-23 ENCOUNTER — Ambulatory Visit (HOSPITAL_COMMUNITY)
Admission: EM | Admit: 2020-02-23 | Discharge: 2020-02-23 | Disposition: A | Discharge status: Home / Self Care | Payer: Medicaid - Out of State | Attending: Family Medicine | Admitting: Family Medicine

## 2020-02-23 DIAGNOSIS — M546 Pain in thoracic spine: Secondary | ICD-10-CM

## 2020-02-23 DIAGNOSIS — M545 Low back pain, unspecified: Secondary | ICD-10-CM

## 2020-02-23 MED ORDER — IBUPROFEN 600 MG PO TABS: 600.0000 mg | ORAL_TABLET | Freq: Four times a day (QID) | ORAL | 0 refills | Status: DC | PRN

## 2020-02-23 MED ORDER — CYCLOBENZAPRINE HCL 5 MG PO TABS: 5.0000 mg | ORAL_TABLET | Freq: Two times a day (BID) | ORAL | 0 refills | Status: DC | PRN
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
## 2020-02-23 NOTE — Discharge Instructions (Signed)
Your back pain is most likely from a muscle strain, symptoms may worsen over the next 2-3 days followed by gradual improvement over the following 2 weeks Use anti-inflammatories for pain/swelling. You may take up to 600 mg Ibuprofen every 8 hours with food. You may supplement Ibuprofen with Tylenol [phone] mg every 8 hours.  You may use flexeril as needed to help with pain. This is a muscle relaxer and causes sedation- please use only at bedtime or when you will be home and not have to drive/work Avoid heavy lifting  Follow up if not improving, not worsening, developing weakness, numbness, tingling, issues with urination/bowels
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
## 2021-09-24 ENCOUNTER — Encounter: Payer: Self-pay | Admitting: Nurse Practitioner

## 2021-09-24 DIAGNOSIS — E119 Type 2 diabetes mellitus without complications: Secondary | ICD-10-CM | POA: Insufficient documentation

## 2021-09-24 DIAGNOSIS — F32A Depression, unspecified: Secondary | ICD-10-CM | POA: Insufficient documentation

## 2021-09-24 DIAGNOSIS — Z6831 Body mass index (BMI) 31.0-31.9, adult: Secondary | ICD-10-CM | POA: Insufficient documentation

## 2021-09-24 DIAGNOSIS — F419 Anxiety disorder, unspecified: Secondary | ICD-10-CM | POA: Insufficient documentation

## 2021-09-24 DIAGNOSIS — F17200 Nicotine dependence, unspecified, uncomplicated: Secondary | ICD-10-CM | POA: Insufficient documentation

## 2021-09-25 ENCOUNTER — Encounter: Payer: Medicaid - Out of State | Admitting: Nurse Practitioner

## 2021-12-20 ENCOUNTER — Encounter: Payer: Self-pay | Admitting: Family Medicine

## 2021-12-20 ENCOUNTER — Encounter (INDEPENDENT_AMBULATORY_CARE_PROVIDER_SITE_OTHER): Payer: Self-pay

## 2021-12-20 ENCOUNTER — Other Ambulatory Visit: Payer: Self-pay

## 2021-12-20 ENCOUNTER — Ambulatory Visit (INDEPENDENT_AMBULATORY_CARE_PROVIDER_SITE_OTHER): Payer: Medicaid - Out of State | Admitting: Family Medicine

## 2021-12-20 VITALS — BP 122/83 | HR 94 | Temp 97.8°F | Resp 16 | Ht 59.75 in | Wt 168.6 lb

## 2021-12-20 DIAGNOSIS — F17210 Nicotine dependence, cigarettes, uncomplicated: Secondary | ICD-10-CM

## 2021-12-20 DIAGNOSIS — F17200 Nicotine dependence, unspecified, uncomplicated: Secondary | ICD-10-CM

## 2021-12-20 DIAGNOSIS — J4530 Mild persistent asthma, uncomplicated: Secondary | ICD-10-CM | POA: Diagnosis not present

## 2021-12-20 DIAGNOSIS — E1169 Type 2 diabetes mellitus with other specified complication: Secondary | ICD-10-CM

## 2021-12-20 DIAGNOSIS — M62838 Other muscle spasm: Secondary | ICD-10-CM

## 2021-12-20 DIAGNOSIS — G4700 Insomnia, unspecified: Secondary | ICD-10-CM

## 2021-12-20 DIAGNOSIS — F419 Anxiety disorder, unspecified: Secondary | ICD-10-CM | POA: Diagnosis not present

## 2021-12-20 DIAGNOSIS — Z794 Long term (current) use of insulin: Secondary | ICD-10-CM

## 2021-12-20 DIAGNOSIS — Z7689 Persons encountering health services in other specified circumstances: Secondary | ICD-10-CM

## 2021-12-20 DIAGNOSIS — F32A Depression, unspecified: Secondary | ICD-10-CM | POA: Diagnosis not present

## 2021-12-20 MED ORDER — CYCLOBENZAPRINE HCL 5 MG PO TABS
5.0000 mg | ORAL_TABLET | Freq: Two times a day (BID) | ORAL | 1 refills | Status: DC | PRN
Start: 2021-12-20 — End: 2022-01-09

## 2021-12-20 MED ORDER — ALBUTEROL SULFATE HFA 108 (90 BASE) MCG/ACT IN AERS: 1.0000 | INHALATION_SPRAY | Freq: Four times a day (QID) | RESPIRATORY_TRACT | 0 refills | Status: AC | PRN

## 2021-12-20 MED ORDER — HYDROXYZINE HCL 50 MG PO TABS: 50.0000 mg | ORAL_TABLET | Freq: Two times a day (BID) | ORAL | 1 refills | Status: DC | PRN

## 2021-12-20 MED ORDER — FLUOXETINE HCL 20 MG PO TABS: 20.0000 mg | ORAL_TABLET | Freq: Every day | ORAL | 3 refills | Status: AC

## 2021-12-20 NOTE — Progress Notes (Signed)
Patient need to talk about her medication for her DM2  Patient is her to establish care with provider to help with DM control

## 2021-12-21 ENCOUNTER — Other Ambulatory Visit: Payer: Self-pay | Admitting: Family Medicine

## 2021-12-21 ENCOUNTER — Encounter: Payer: Self-pay | Admitting: Family Medicine

## 2021-12-21 LAB — MICROALBUMIN / CREATININE URINE RATIO
Creatinine, Urine: 45 mg/dL
Microalb/Creat Ratio: 59 mg/g creat — ABNORMAL HIGH (ref 0–29)
Microalbumin, Urine: 26.4 ug/mL

## 2021-12-21 NOTE — Progress Notes (Signed)
New Patient Office Visit  Subjective:  Patient ID: Marilyn Joseph, female    DOB: July 19, 1986  Age: 35 y.o. MRN: [id number]  CC:  Chief Complaint  Patient presents with   Establish Care    HPI Marilyn Joseph presents for to establish care and for review of her chronic med issues particularly diabetes with med refills.  Past Medical History:  Diagnosis Date   Depression    Diabetes mellitus without complication ([place])    Hypertension     Past Surgical History:  Procedure Laterality Date   APPENDECTOMY     GASTROSTOMY W/ FEEDING TUBE      Family History  Problem Relation Age of Onset   Diabetes Father    Hypertension Father     Social History   Socioeconomic History   Marital status: Single    Spouse name: Not on file   Number of children: Not on file   Years of education: Not on file   Highest education level: Not on file  Occupational History   Not on file  Tobacco Use   Smoking status: Every Day    Packs/day: 0.50    Types: Cigarettes   Smokeless tobacco: Never  Substance and Sexual Activity   Alcohol use: No   Drug use: No   Sexual activity: Not on file  Other Topics Concern   Not on file  Social History Narrative   Not on file   Social Determinants of Health   Financial Resource Strain: Not on file  Food Insecurity: Not on file  Transportation Needs: Not on file  Physical Activity: Not on file  Stress: Not on file  Social Connections: Not on file  Intimate Partner Violence: Not on file    ROS Review of Systems  All other systems reviewed and are negative.  Objective:   Today's Vitals: BP 122/83    Pulse 94    Temp 97.8 F (36.6 C) (Oral)    Resp 16    Ht 4' 11.75" (1.518 m)    Wt 168 lb 9.6 oz (76.5 kg)    SpO2 94%    BMI 33.20 kg/m   Physical Exam Vitals and nursing note reviewed.  Constitutional:      General: She is not in acute distress. Cardiovascular:     Rate and Rhythm: Normal rate and regular rhythm.  Pulmonary:      Effort: Pulmonary effort is normal.     Breath sounds: Normal breath sounds.  Abdominal:     Palpations: Abdomen is soft.     Tenderness: There is no abdominal tenderness.  Musculoskeletal:     Right lower leg: No edema.     Left lower leg: No edema.  Neurological:     General: No focal deficit present.     Mental Status: She is alert and oriented to person, place, and time.  Psychiatric:        Mood and Affect: Mood normal.        Behavior: Behavior normal.    Assessment & Plan:   1. Type 2 diabetes mellitus with other specified complication, with long-term current use of insulin [facility]) Patient referred to [facility] for med management.  - Microalbumin / creatinine urine ratio - Hemoglobin A1c - CMP14+EGFR - Lipid Panel  2. Mild persistent reactive airway disease without complication Albuterol MDI prescribed.   3. Anxiety and depression Appears stable. Prozac and hydroxyzine prescribed.   4. Muscle spasm Flexeril prescribed.   5. Insomnia, unspecified type  6. Current smoker Discussed reduction or cessation from current 1/2 ppd  7. Encounter to establish care     Outpatient Encounter Medications as of 12/20/2021  Medication Sig   budesonide-formoterol (SYMBICORT) 160-4.5 MCG/ACT inhaler Inhale 2 puffs as needed into the lungs.    cyclobenzaprine (FLEXERIL) 5 MG tablet Take 1-2 tablets (5-10 mg total) by mouth 2 (two) times daily as needed for muscle spasms.   FLUoxetine (PROZAC) 20 MG tablet Take 1 tablet (20 mg total) by mouth daily.   montelukast (SINGULAIR) 10 MG tablet SMARTSIG:1 Tablet(s) By Mouth Every Evening   zolpidem (AMBIEN) 10 MG tablet Take 10 mg at bedtime by mouth.   [DISCONTINUED] albuterol (PROVENTIL HFA;VENTOLIN HFA) 108 (90 Base) MCG/ACT inhaler Inhale 1 puff into the lungs every 6 (six) hours as needed for wheezing or shortness of breath.   [DISCONTINUED] cyclobenzaprine (FLEXERIL) 5 MG tablet Take 1-2 tablets (5-10 mg total) by mouth 2 (two) times  daily as needed for muscle spasms.   albuterol (VENTOLIN HFA) 108 (90 Base) MCG/ACT inhaler Inhale 1 puff into the lungs every 6 (six) hours as needed for wheezing or shortness of breath.   buPROPion (WELLBUTRIN SR) 150 MG 12 hr tablet Take 150 mg 2 (two) times daily by mouth. (Patient not taking: Reported on 12/20/2021)   busPIRone (BUSPAR) 15 MG tablet Take 15 mg 2 (two) times daily by mouth. (Patient not taking: Reported on 77/93/9030)   FOLIC ACID PO Take 1 tablet by mouth daily. (Patient not taking: Reported on 12/20/2021)   glipiZIDE (GLUCOTROL XL) 5 MG 24 hr tablet Take 5 mg by mouth daily. (Patient not taking: Reported on 12/20/2021)   hydrOXYzine (ATARAX) 50 MG tablet Take 1 tablet (50 mg total) by mouth 2 (two) times daily as needed.   hydrOXYzine (ATARAX/VISTARIL) 50 MG tablet TAKE 1/2 TO 1 TABLET BY MOUTH 3 TIMES A DAY AS NEEDED FOR ANXIETY (Patient not taking: Reported on 12/20/2021)   ibuprofen (ADVIL) 600 MG tablet Take 1 tablet (600 mg total) by mouth every 6 (six) hours as needed. (Patient not taking: Reported on 12/20/2021)   lamoTRIgine (LAMICTAL) 100 MG tablet Take 100 mg daily by mouth. (Patient not taking: Reported on 12/20/2021)   LANTUS SOLOSTAR 100 UNIT/ML Solostar Pen INJECT  40 UNITS SUBCUTANEOUSLY IN THE EVENING (Patient not taking: Reported on 12/20/2021)   NOVOLOG FLEXPEN 100 UNIT/ML FlexPen IF BLOOD SUGAR IS 100-149  2 UNITS, 150-199 4 UNITS, 200-249 6 UNITS, 250-299  8 UNITS, 300-349 10 UNITS,350-399 12 UNITS ,200-449 12 UNITS (Patient not taking: Reported on 12/20/2021)   REXULTI 2 MG TABS Take 1 tablet daily by mouth. (Patient not taking: Reported on 12/20/2021)   traZODone (DESYREL) 100 MG tablet Take 100 mg at bedtime by mouth. (Patient not taking: Reported on 12/20/2021)   [DISCONTINUED] hydrOXYzine (ATARAX) 50 MG tablet Take by mouth. (Patient not taking: Reported on 12/20/2021)   No facility-administered encounter medications on file as of 12/20/2021.     Follow-up: Return in about 4 weeks (around 01/17/2022) for follow up.   Becky Sax, MD

## 2022-01-04 ENCOUNTER — Other Ambulatory Visit: Payer: Self-pay | Admitting: Family Medicine

## 2022-01-08 NOTE — Telephone Encounter (Signed)
Copied from CRM [id number]. Topic: General - Other >> Jan 04, 2022  1:04 PM Herby Abraham C wrote: Reason for CRM: pt's spouse called in for assistance. Pt has an apt with Franky Macho. Spouse says that pt is out of her diabetes medication and need her apt with Franky Macho sooner if possible.    Please assist futher.

## 2022-01-09 ENCOUNTER — Ambulatory Visit: Payer: Medicaid - Out of State | Attending: Family Medicine | Admitting: Pharmacist

## 2022-01-09 DIAGNOSIS — E1169 Type 2 diabetes mellitus with other specified complication: Secondary | ICD-10-CM | POA: Diagnosis not present

## 2022-01-09 MED ORDER — JANUMET XR 50-500 MG PO TB24: 1.0000 | ORAL_TABLET | Freq: Every day | ORAL | 2 refills | Status: DC

## 2022-01-09 MED ORDER — ACCU-CHEK GUIDE W/DEVICE KIT: PACK | 0 refills | Status: AC

## 2022-01-09 MED ORDER — ACCU-CHEK GUIDE VI STRP: ORAL_STRIP | 3 refills | Status: AC

## 2022-01-09 MED ORDER — ACCU-CHEK SOFTCLIX LANCETS MISC: 3 refills | Status: AC

## 2022-01-09 NOTE — Progress Notes (Signed)
° °  °  Patient visit virtually in the context of Covid-19 pandemic.   I connected with Marilyn Joseph on 01/09/2022 at 2:30 PM by video and verified that I am speaking with the correct person using two identifiers..    I discussed the limitations, risks, security and privacy concerns of performing an evaluation and management service by video and the availability of in person appointments. I also discussed with the patient that there may be a patient responsible charge related to this service. The patient expressed understanding and agreed to proceed.   Patient location:  at home    My Location:  in my office    Persons on the call:  myself, patient  S:    PCP: Dr. Redmond Pulling  No chief complaint on file.  Patient in good spirits.  Presents for diabetes evaluation, education, and management Patient was referred and last seen by Primary Care Provider on 12/20/2021.   Patient reports Diabetes was diagnosed in 2004. Was initially placed on metformin. Stopped this d/t diarrhea. No hx of ACS/stroke/CHF/CKD. No pancreatitis or thyroid cancer. She tells me that she used to take Janumet with good success.   Family/Social History:  Fhx: DM, HTN Tobacco: 0.5 PPD current smoker  Alcohol: none   Insurance coverage/medication affordability: [place] Medicaid   Medication adherence reported.   Current diabetes medications include: glipizide 5 mg XL  Patient denies hypoglycemic events.  Patient reported dietary habits:  - "Sometimes it's a struggle"   Patient-reported exercise habits: none outside of work    Patient reports polyuria, polydipsia, polyphagia.  Patient denies neuropathy (nerve pain). Patient denies visual changes. Patient reports self foot exams.     O:  No results found for: HGBA1C There were no vitals filed for this visit.  Lipid Panel  No results found for: CHOL, TRIG, HDL, CHOLHDL, VLDL, LDLCALC, LDLDIRECT  Home fasting blood sugars: not checking   2 hour post-meal/random  blood sugars: not checking.  Clinical Atherosclerotic Cardiovascular Disease (ASCVD): No  The ASCVD Risk score (Arnett DK, et al., 2019) failed to calculate for the following reasons:   The 2019 ASCVD risk score is only valid for ages 71 to 43   A/P: Diabetes longstanding currently with unknown level of control. Patient is able to verbalize appropriate hypoglycemia management plan. Medication adherence appears appropriate. I think a GLP-1 RA would be more effective for her but she requests to resume the [place]. She is symptomatic so it is imperative that she gets labs when she sees Dr. Redmond Pulling next week. -Started Janumet XR 50-500mg  daily.  -Continued glipizide XL 5 mg daily for now. -Extensively discussed pathophysiology of diabetes, recommended lifestyle interventions, dietary effects on blood sugar control -Counseled on s/sx of and management of hypoglycemia -Next A1C anticipated next week.   Written patient instructions provided.  Total time in face to face counseling 30 minutes.   Follow up Pharmacist Clinic Visit in 1 month.     Benard Halsted, PharmD, [facility] [place], [place] [phone]

## 2022-01-11 ENCOUNTER — Telehealth: Payer: Self-pay | Admitting: Family Medicine

## 2022-01-11 NOTE — Telephone Encounter (Signed)
Copied from CRM [id number]. Topic: General - Other >> Jan 09, 2022  3:37 PM Traci Sermon wrote: Reason for CRM: Pt called in stating she had a tele visit with Franky Macho, and he told her he would sent over a meter, and she contacted the pharmacy and they told her it has not been sent, please advise.

## 2022-01-11 NOTE — Telephone Encounter (Signed)
Call placed to patient. She picked up her meter and medication from [facility] today.

## 2022-01-17 ENCOUNTER — Other Ambulatory Visit: Payer: Self-pay

## 2022-01-17 ENCOUNTER — Ambulatory Visit (INDEPENDENT_AMBULATORY_CARE_PROVIDER_SITE_OTHER): Payer: Medicaid Other | Admitting: Family Medicine

## 2022-01-17 VITALS — BP 116/79 | HR 95 | Temp 98.5°F | Resp 16 | Wt 166.2 lb

## 2022-01-17 DIAGNOSIS — F32A Depression, unspecified: Secondary | ICD-10-CM | POA: Diagnosis not present

## 2022-01-17 DIAGNOSIS — E1169 Type 2 diabetes mellitus with other specified complication: Secondary | ICD-10-CM

## 2022-01-17 DIAGNOSIS — F419 Anxiety disorder, unspecified: Secondary | ICD-10-CM

## 2022-01-17 DIAGNOSIS — Z794 Long term (current) use of insulin: Secondary | ICD-10-CM | POA: Diagnosis not present

## 2022-01-17 MED ORDER — BUPROPION HCL ER (SR) 150 MG PO TB12: 150.0000 mg | ORAL_TABLET | Freq: Two times a day (BID) | ORAL | 0 refills | Status: AC

## 2022-01-17 MED ORDER — FLUOXETINE HCL 20 MG PO CAPS: 20.0000 mg | ORAL_CAPSULE | Freq: Every day | ORAL | 0 refills | Status: DC

## 2022-01-17 NOTE — Progress Notes (Signed)
New Patient Office Visit  Subjective:  Patient ID: Marilyn Joseph, female    DOB: 09-18-1986  Age: 36 y.o. MRN: [id number]  CC:  Chief Complaint  Patient presents with   Follow-up   Depression   Diabetes    HPI Marilyn Joseph presents for follow up of chronic med issues including diabetes and anxiety/depression. She reports that she would like an increase in her dosage of prozac but that she has not been taking the wellbutrin.   Past Medical History:  Diagnosis Date   Depression    Diabetes mellitus without complication ([place])    Hypertension     Past Surgical History:  Procedure Laterality Date   APPENDECTOMY     GASTROSTOMY W/ FEEDING TUBE      Family History  Problem Relation Age of Onset   Diabetes Father    Hypertension Father     Social History   Socioeconomic History   Marital status: Single    Spouse name: Not on file   Number of children: Not on file   Years of education: Not on file   Highest education level: Not on file  Occupational History   Not on file  Tobacco Use   Smoking status: Every Day    Packs/day: 0.50    Types: Cigarettes   Smokeless tobacco: Never  Substance and Sexual Activity   Alcohol use: No   Drug use: No   Sexual activity: Not on file  Other Topics Concern   Not on file  Social History Narrative   Not on file   Social Determinants of Health   Financial Resource Strain: Not on file  Food Insecurity: Not on file  Transportation Needs: Not on file  Physical Activity: Not on file  Stress: Not on file  Social Connections: Not on file  Intimate Partner Violence: Not on file    ROS Review of Systems  Psychiatric/Behavioral:  Positive for sleep disturbance. Negative for self-injury and suicidal ideas. The patient is nervous/anxious.   All other systems reviewed and are negative.  Objective:   Today's Vitals: BP 116/79    Pulse 95    Temp 98.5 F (36.9 C) (Oral)    Resp 16    Wt 166 lb 3.2 oz (75.4 kg)    SpO2 96%     BMI 32.73 kg/m   Physical Exam Vitals and nursing note reviewed.  Constitutional:      General: She is not in acute distress. Cardiovascular:     Rate and Rhythm: Normal rate and regular rhythm.  Pulmonary:     Effort: Pulmonary effort is normal.     Breath sounds: Normal breath sounds.  Abdominal:     Palpations: Abdomen is soft.     Tenderness: There is no abdominal tenderness.  Musculoskeletal:     Right lower leg: No edema.     Left lower leg: No edema.  Neurological:     General: No focal deficit present.     Mental Status: She is alert and oriented to person, place, and time.  Psychiatric:        Mood and Affect: Mood is anxious.        Behavior: Behavior normal.    Assessment & Plan:   1. Anxiety and depression Will restart wellbutrin which was refilled and increased prozac. Referred to [facility] for med management.  - Ambulatory referral to Psychiatry  2. Type 2 diabetes mellitus with other specified complication, with long-term current use of insulin (HCC) Continue  present management and monitor.  - POCT glycosylated hemoglobin (Hb A1C)    Outpatient Encounter Medications as of 01/17/2022  Medication Sig   Accu-Chek Softclix Lancets lancets Use to check blood sugar once daily. E11.69   albuterol (VENTOLIN HFA) 108 (90 Base) MCG/ACT inhaler Inhale 1 puff into the lungs every 6 (six) hours as needed for wheezing or shortness of breath.   Blood Glucose Monitoring Suppl (ACCU-CHEK GUIDE) w/Device KIT Use to check blood sugar once daily. E11.69   Blood Glucose Monitoring Suppl (GE100 BLOOD GLUCOSE SYSTEM) w/Device KIT by Does not apply route.   budesonide-formoterol (SYMBICORT) 160-4.5 MCG/ACT inhaler Inhale 2 puffs as needed into the lungs.    buPROPion (WELLBUTRIN SR) 150 MG 12 hr tablet Take 150 mg by mouth 2 (two) times daily.   busPIRone (BUSPAR) 15 MG tablet Take 15 mg by mouth 2 (two) times daily.   cyclobenzaprine (FLEXERIL) 5 MG tablet TAKE 1 TO 2 TABLETS BY  MOUTH 2 (TWO) TIMES DAILY AS NEEDED FOR MUSCLE SPASMS.   FLUoxetine (PROZAC) 20 MG tablet Take 1 tablet (20 mg total) by mouth daily.   glipiZIDE (GLUCOTROL XL) 5 MG 24 hr tablet Take 5 mg by mouth daily.   glucose blood (ACCU-CHEK GUIDE) test strip Use to check blood sugar once daily. E11.69   hydrOXYzine (ATARAX) 50 MG tablet TAKE 1 TABLET (50 MG TOTAL) BY MOUTH 2 (TWO) TIMES DAILY AS NEEDED.   hydrOXYzine (ATARAX/VISTARIL) 50 MG tablet    lamoTRIgine (LAMICTAL) 100 MG tablet Take 100 mg by mouth daily.   levofloxacin (LEVAQUIN) 750 MG tablet 1 tab(s) orally every 24 hours for 5 days   Melatonin 10 MG CAPS 1 cap(s) orally once a day (at bedtime) for 30 days   metFORMIN (GLUCOPHAGE) 1000 MG tablet 1 tab(s) orally 2 times a day for 90 days   montelukast (SINGULAIR) 10 MG tablet SMARTSIG:1 Tablet(s) By Mouth Every Evening   REXULTI 2 MG TABS Take 1 tablet by mouth daily.   sitaGLIPtin (JANUVIA) 100 MG tablet 1 tab(s) orally daily for 90 days   SitaGLIPtin-MetFORMIN HCl (JANUMET XR) 50-500 MG TB24 Take 1 tablet by mouth daily.   traZODone (DESYREL) 100 MG tablet Take 100 mg by mouth at bedtime.   [DISCONTINUED] FOLIC ACID PO Take 1 tablet by mouth daily.   [DISCONTINUED] zolpidem (AMBIEN) 10 MG tablet Take 10 mg at bedtime by mouth.   budesonide-formoterol (SYMBICORT) 160-4.5 MCG/ACT inhaler 2 puff(s) inhaled 2 times a day   FLUoxetine (PROZAC) 20 MG capsule Take 20 mg by mouth daily.   loratadine (CLARITIN REDITABS) 10 MG dissolvable tablet 1 tab(s) orally once a day for allergies for 30 days   Rosuvastatin Calcium 10 MG CPSP 1 tab(s) orally once a day for 90 days   No facility-administered encounter medications on file as of 01/17/2022.    Follow-up: No follow-ups on file.   Becky Sax, MD

## 2022-01-17 NOTE — Progress Notes (Signed)
12.2Patient said that her fluoxtine is not helping her it may need to be adjusted   Patient need a letter for emotional support dog

## 2022-01-18 LAB — POCT GLYCOSYLATED HEMOGLOBIN (HGB A1C)

## 2022-01-21 ENCOUNTER — Encounter: Payer: Self-pay | Admitting: Family Medicine

## 2022-02-08 NOTE — Progress Notes (Unsigned)
° ° °  S:    Marilyn Joseph is a 36 y.o. female who presents for diabetes evaluation, education, and management. PMH is significant for T2DM, anxiety and depression. Patient was referred and last seen by Primary Care Provider, Dr. Andrey Campanile, on 01/17/22, and A1c was ***. At last visit with CPP on 01/09/22, Janumet was resumed.   Today, {He/she (caps):30048} arrives in *** spirits and presents {w-w/o:315700} assistance. *** *** update med list (janumet and individual meds still on there) -what was the a1c? -increase janumet to BID?   Patient reports Diabetes was diagnosed in 2004. Was initially placed on metformin. Stopped this d/t diarrhea. No hx of ACS/stroke/CHF/CKD. No pancreatitis or thyroid cancer.   Family/Social History:  Fhx: DM, HTN Tobacco: 0.5 PPD current smoker  Alcohol: none   Current diabetes medications include: glipizide 5 mg XL, Janumet 50-500 mg daily Current hypertension medications include: none Current hyperlipidemia medications include: rosuvastatin 10 mg daily  Patient states that {He/she (caps):30048} {Is/is not:9024} taking {his/her/their:21314} medications as prescribed. Patient {Actions; denies-reports:120008} adherence with medications. Patient states that {He/she (caps):30048} misses {his/her/their:21314} medications *** times per week, on average.  Do you feel that your medications are working for you? {YES NO:22349} Have you been experiencing any side effects to the medications prescribed? {YES NO:22349} Do you have any problems obtaining medications due to transportation or finances? {YES [id number] Insurance coverage: [place] Medicaid  Patient {Actions; denies-reports:120008} hypoglycemic events.  Reported home fasting blood sugars: ***  Reported 2 hour post-meal/random blood sugars: ***.  Patient {Actions; denies-reports:120008} nocturia (nighttime urination).  Patient {Actions; denies-reports:120008} neuropathy (nerve pain). Patient {Actions;  denies-reports:120008} visual changes. Patient {Actions; denies-reports:120008} self foot exams.   Patient reported dietary habits: Eats *** meals/day Breakfast: *** Lunch: *** Dinner: *** Snacks: *** Drinks: ***  Within the past 12 months, did you worry whether your food would run out before you got money to buy more? {YES NO:22349} Within the past 12 months, did the food you bought run out, and you didnt have money to get more? {YES NO:22349}  Patient-reported exercise habits: ***   O:  7 day average blood glucose: ***  No results found for: HGBA1C There were no vitals filed for this visit.  Lipid Panel  No results found for: CHOL, TRIG, HDL, CHOLHDL, VLDL, LDLCALC, LDLDIRECT  Clinical Atherosclerotic Cardiovascular Disease (ASCVD): No  The ASCVD Risk score (Arnett DK, et al., 2019) failed to calculate for the following reasons:   The 2019 ASCVD risk score is only valid for ages 38 to 34    A/P: Diabetes longstanding*** currently ***. Patient is *** able to verbalize appropriate hypoglycemia management plan. Medication adherence appears ***. Control is suboptimal due to ***. -*** -Patient educated on purpose, proper use, and potential adverse effects of ***.  -Extensively discussed pathophysiology of diabetes, recommended lifestyle interventions, dietary effects on blood sugar control.  -Counseled on s/sx of and management of hypoglycemia.  -Next A1c anticipated April 2023.   Written patient instructions provided. Patient verbalized understanding of treatment plan. Total time in face to face counseling *** minutes.    Follow up pharmacist***PCP clinic visit in ***. Patient seen with ***.

## 2022-02-11 ENCOUNTER — Ambulatory Visit: Payer: Medicaid Other | Admitting: Pharmacist

## 2022-02-19 ENCOUNTER — Ambulatory Visit: Payer: Medicaid - Out of State | Admitting: Family Medicine

## 2022-02-26 ENCOUNTER — Other Ambulatory Visit: Payer: Self-pay | Admitting: Family Medicine

## 2022-03-07 MED ORDER — FLUOXETINE HCL 20 MG PO CAPS: 20.0000 mg | ORAL_CAPSULE | Freq: Every day | ORAL | 0 refills | Status: DC

## 2022-03-11 ENCOUNTER — Other Ambulatory Visit: Payer: Self-pay | Admitting: Family Medicine

## 2022-03-12 ENCOUNTER — Ambulatory Visit: Payer: Medicaid Other | Admitting: Pharmacist

## 2022-03-12 NOTE — Telephone Encounter (Signed)
Requested medication (s) are due for refill today - no ? ?Requested medication (s) are on the active medication list -yes ? ?Future visit scheduled -no ? ?Last refill: 01/09/22 #30 2RF ? ?Notes to clinic: Request RF: fails lab protocol ? ?Requested Prescriptions  ?Pending Prescriptions Disp Refills  ? JANUMET XR 50-500 MG TB24 [Pharmacy Med Name: JANUMET XR 50-500 MG ORAL TABLET EXTENDED RELEASE 24 HOUR] 30 tablet 2  ?  Sig: Take 1 tablet by mouth daily.  ?  ? Endocrinology:  Diabetes - Biguanide + DPP-4 Inhibitor Combos Failed - 03/11/2022  2:21 PM  ?  ?  Failed - HBA1C is between 0 and 7.9 and within 180 days  ?  No results found for: HGBA1C, LABA1C  ?  ?  ?  Failed - Cr in normal range and within 360 days  ?  Creatinine, Ser  ?Date Value Ref Range Status  ?08/04/2018 0.60 0.44 - 1.00 mg/dL Final  ?  ?  ?  ?  Failed - eGFR in normal range and within 360 days  ?  GFR calc Af Amer  ?Date Value Ref Range Status  ?11/07/2017 >60 >60 mL/min Final  ?  Comment:  ?  (NOTE) ?The eGFR has been calculated using the CKD EPI equation. ?This calculation has not been validated in all clinical situations. ?eGFR's persistently <60 mL/min signify possible Chronic Kidney ?Disease. ?  ? ?GFR calc non Af Amer  ?Date Value Ref Range Status  ?11/07/2017 >60 >60 mL/min Final  ?  ?  ?  ?  Failed - B12 Level in normal range and within 720 days  ?  No results found for: VITAMINB12  ?  ?  ?  Failed - CBC within normal limits and completed in the last 12 months  ?  WBC  ?Date Value Ref Range Status  ?11/07/2017 13.7 (H) 4.0 - 10.5 K/uL Final  ? ?RBC  ?Date Value Ref Range Status  ?11/07/2017 5.03 3.87 - 5.11 MIL/uL Final  ? ?Hemoglobin  ?Date Value Ref Range Status  ?08/04/2018 13.9 12.0 - 15.0 g/dL Final  ? ?HCT  ?Date Value Ref Range Status  ?08/04/2018 41.0 36.0 - 46.0 % Final  ? ?MCHC  ?Date Value Ref Range Status  ?11/07/2017 33.4 30.0 - 36.0 g/dL Final  ? ?MCH  ?Date Value Ref Range Status  ?11/07/2017 28.6 26.0 - 34.0 pg Final  ? ?MCV   ?Date Value Ref Range Status  ?11/07/2017 85.7 78.0 - 100.0 fL Final  ? ?No results found for: PLTCOUNTKUC, LABPLAT, [place] ?RDW  ?Date Value Ref Range Status  ?11/07/2017 13.2 11.5 - 15.5 % Final  ? ?  ?  ?  Passed - Valid encounter within last 6 months  ?  Recent Outpatient Visits   ? ?      ? 1 month ago Anxiety and depression  ? Primary Care at [facility], [place]  ? 2 months ago Type 2 diabetes mellitus with other specified complication, without long-term current use of insulin ([place])  ? [place], RPH-CPP  ? 2 months ago Type 2 diabetes mellitus with other specified complication, with long-term current use of insulin ([place])  ? Primary Care at [facility], [place]  ? ?  ?  ?Future Appointments   ? ?        ? Today Marilyn Joseph, [name], [place]  ? ?  ? ?  ?  ?  ? ? ? ?  Requested Prescriptions  ?Pending Prescriptions Disp Refills  ? JANUMET XR 50-500 MG TB24 [Pharmacy Med Name: JANUMET XR 50-500 MG ORAL TABLET EXTENDED RELEASE 24 HOUR] 30 tablet 2  ?  Sig: Take 1 tablet by mouth daily.  ?  ? Endocrinology:  Diabetes - Biguanide + DPP-4 Inhibitor Combos Failed - 03/11/2022  2:21 PM  ?  ?  Failed - HBA1C is between 0 and 7.9 and within 180 days  ?  No results found for: HGBA1C, LABA1C  ?  ?  ?  Failed - Cr in normal range and within 360 days  ?  Creatinine, Ser  ?Date Value Ref Range Status  ?08/04/2018 0.60 0.44 - 1.00 mg/dL Final  ?  ?  ?  ?  Failed - eGFR in normal range and within 360 days  ?  GFR calc Af Amer  ?Date Value Ref Range Status  ?11/07/2017 >60 >60 mL/min Final  ?  Comment:  ?  (NOTE) ?The eGFR has been calculated using the CKD EPI equation. ?This calculation has not been validated in all clinical situations. ?eGFR's persistently <60 mL/min signify possible Chronic Kidney ?Disease. ?  ? ?GFR calc non Af Amer  ?Date Value Ref Range Status  ?11/07/2017 >60 >60 mL/min Final  ?  ?   ?  ?  Failed - B12 Level in normal range and within 720 days  ?  No results found for: VITAMINB12  ?  ?  ?  Failed - CBC within normal limits and completed in the last 12 months  ?  WBC  ?Date Value Ref Range Status  ?11/07/2017 13.7 (H) 4.0 - 10.5 K/uL Final  ? ?RBC  ?Date Value Ref Range Status  ?11/07/2017 5.03 3.87 - 5.11 MIL/uL Final  ? ?Hemoglobin  ?Date Value Ref Range Status  ?08/04/2018 13.9 12.0 - 15.0 g/dL Final  ? ?HCT  ?Date Value Ref Range Status  ?08/04/2018 41.0 36.0 - 46.0 % Final  ? ?MCHC  ?Date Value Ref Range Status  ?11/07/2017 33.4 30.0 - 36.0 g/dL Final  ? ?MCH  ?Date Value Ref Range Status  ?11/07/2017 28.6 26.0 - 34.0 pg Final  ? ?MCV  ?Date Value Ref Range Status  ?11/07/2017 85.7 78.0 - 100.0 fL Final  ? ?No results found for: PLTCOUNTKUC, LABPLAT, [place] ?RDW  ?Date Value Ref Range Status  ?11/07/2017 13.2 11.5 - 15.5 % Final  ? ?  ?  ?  Passed - Valid encounter within last 6 months  ?  Recent Outpatient Visits   ? ?      ? 1 month ago Anxiety and depression  ? Primary Care at [facility], [place]  ? 2 months ago Type 2 diabetes mellitus with other specified complication, without long-term current use of insulin ([place])  ? [place], RPH-CPP  ? 2 months ago Type 2 diabetes mellitus with other specified complication, with long-term current use of insulin ([place])  ? Primary Care at [facility], [place]  ? ?  ?  ?Future Appointments   ? ?        ? Today Marilyn Joseph, [name], [place]  ? ?  ? ?  ?  ?  ? ? ? ?

## 2022-04-11 ENCOUNTER — Other Ambulatory Visit: Payer: Self-pay | Admitting: Family Medicine

## 2022-04-25 ENCOUNTER — Telehealth (INDEPENDENT_AMBULATORY_CARE_PROVIDER_SITE_OTHER): Payer: Self-pay

## 2022-04-25 NOTE — Telephone Encounter (Signed)
Marilyn Joseph,  ? ?Can we have this patient placed on my schedule? ?

## 2022-04-25 NOTE — Telephone Encounter (Signed)
Copied from CRM [id number]. Topic: General - Inquiry ?>> Apr 24, 2022  5:03 PM Aretta Nip wrote: ?Luke Pt would like to have an appt as soon as possible.  States afternoons are best to reach. Call [phone] States been trying to get an appt for sometime with no call backs Note different # [phone] ?

## 2022-04-29 ENCOUNTER — Other Ambulatory Visit: Payer: Self-pay | Admitting: Family Medicine

## 2022-05-06 ENCOUNTER — Telehealth (HOSPITAL_BASED_OUTPATIENT_CLINIC_OR_DEPARTMENT_OTHER): Payer: Medicaid Other | Admitting: Pharmacist

## 2022-05-06 DIAGNOSIS — E1169 Type 2 diabetes mellitus with other specified complication: Secondary | ICD-10-CM

## 2022-05-06 MED ORDER — JANUMET 50-1000 MG PO TABS: 1.0000 | ORAL_TABLET | Freq: Two times a day (BID) | ORAL | 2 refills | Status: DC

## 2022-05-06 NOTE — Progress Notes (Signed)
? ?  ?  Patient visit virtually in the context of Covid-19 pandemic. ?  ?I connected with Marilyn Joseph on 05/06/2022 at 2:00 PM by video and verified that I am speaking with the correct person using two identifiers..  ?  ?I discussed the limitations, risks, security and privacy concerns of performing an evaluation and management service by video and the availability of in person appointments. I also discussed with the patient that there may be a patient responsible charge related to this service. The patient expressed understanding and agreed to proceed. ?  ?Patient location:  at home  ?  ?My Location:  in my office  ?  ?Persons on the call: myself, patient ? ?S:    ?PCP: Dr. Andrey Campanile ? ?No chief complaint on file. ? ?Patient in good spirits.  Presents for diabetes evaluation, education, and management ?Patient was referred and last seen by Primary Care Provider on 12/20/2021. I saw her on 01/09/2022 and restarted her Janumet with the goal of increasing her dose. She missed two subsequent visits with me. ? ?Family/Social History:  ?Fhx: DM, HTN ?Tobacco: 0.5 PPD current smoker  ?Alcohol: none  ? ?Insurance coverage/medication affordability: [place] Medicaid  ? ?Medication adherence denied with glipizide. She is taking the Janumet.   ?Current diabetes medications include: glipizide 5 mg XL, Janumet 50-500 mg XR daily ? ?Patient denies hypoglycemic events. ? ?Patient reported dietary habits:  ?- "Sometimes it's a struggle"  ? ?Patient-reported exercise habits: none outside of work  ?  ?Patient reports polyuria, polydipsia, polyphagia.  ?Patient denies neuropathy (nerve pain). ?Patient denies visual changes. ?Patient reports self foot exams.  ?  ?O:  ?No results found for: HGBA1C ?There were no vitals filed for this visit. ? ?Lipid Panel  ?No results found for: CHOL, TRIG, HDL, CHOLHDL, VLDL, LDLCALC, LDLDIRECT ? ?Home fasting blood sugars: 300s  ?2 hour post-meal/random blood sugars: 300s ? ?Clinical Atherosclerotic  Cardiovascular Disease (ASCVD): No  ?The ASCVD Risk score (Arnett DK, et al., 2019) failed to calculate for the following reasons: ?  The 2019 ASCVD risk score is only valid for ages 51 to 27  ? ?A/P: ?Diabetes longstanding currently uncontrolled. Patient is able to verbalize appropriate hypoglycemia management plan. Medication adherence appears appropriate. I think a GLP-1 RA would be more effective for her but she requests to increase the Janumet. She continues to be symptomatic so it is imperative that she gets labs when she sees me in two weeks.  ?-Increase Janumet to 50-1000mg  BID for now.   ?-Discontinue glipizide XL 5 mg. Pt is not taking. ?-Extensively discussed pathophysiology of diabetes, recommended lifestyle interventions, dietary effects on blood sugar control ?-Counseled on s/sx of and management of hypoglycemia ?-Next A1C anticipated in 2 weeks. ?  ?Written patient instructions provided.  Total time in face to face counseling 30 minutes.  Follow up Pharmacist Clinic Visit in 2 weeks.    ? ?Butch Penny, PharmD, BCACP, CPP ?Clinical Pharmacist ?[facility] ?[phone] ? ? ?

## 2022-05-08 ENCOUNTER — Ambulatory Visit
Admission: EM | Admit: 2022-05-08 | Discharge: 2022-05-08 | Disposition: A | Discharge status: Home / Self Care | Payer: Medicaid Other | Attending: Physician Assistant | Admitting: Physician Assistant

## 2022-05-08 ENCOUNTER — Encounter: Payer: Self-pay | Admitting: Emergency Medicine

## 2022-05-08 ENCOUNTER — Other Ambulatory Visit: Payer: Self-pay

## 2022-05-08 DIAGNOSIS — R300 Dysuria: Secondary | ICD-10-CM

## 2022-05-08 DIAGNOSIS — R350 Frequency of micturition: Secondary | ICD-10-CM | POA: Diagnosis present

## 2022-05-08 LAB — POCT URINALYSIS DIP (MANUAL ENTRY)
Bilirubin, UA: NEGATIVE
Glucose, UA: 500 mg/dL — AB
Ketones, POC UA: NEGATIVE mg/dL
Nitrite, UA: NEGATIVE
Protein Ur, POC: NEGATIVE mg/dL
Spec Grav, UA: <=1.005 — AB (ref 1.010–1.025)
Urobilinogen, UA: 0.2 E.U./dL
pH, UA: 6 (ref 5.0–8.0)

## 2022-05-08 MED ORDER — SULFAMETHOXAZOLE-TRIMETHOPRIM 800-160 MG PO TABS: 1.0000 | ORAL_TABLET | Freq: Two times a day (BID) | ORAL | 0 refills | Status: AC

## 2022-05-08 NOTE — ED Provider Notes (Addendum)
[facility]    CSN: [id number] Arrival date & time: 05/08/22  1935      History   Chief Complaint Chief Complaint  Patient presents with   Dysuria    HPI Marilyn Joseph is a 36 y.o. female.   Patient presents today with a several month history of intermittent urinary tract infection symptoms.  Reports over the past several days this has worsened and she is now experiencing dysuria, urinary frequency, urinary urgency.  Denies any fever, nausea, vomiting, chest pain, shortness of breath, hematuria.  Denies history of recurrent urinary tract infections or nephrolithiasis.  Denies any recent antibiotics.  She denies any recent urogenital procedure, self-catheterization, single kidney.  She does have a history of diabetes and reports that her blood sugars are not well controlled.  She is working with her PCP for better glycemic control.  She does not take SGLT2 inhibitor.  Denies any vaginal symptoms including pelvic pain, abdominal pain, vaginal discharge, vaginal bleeding.   Past Medical History:  Diagnosis Date   Depression    Diabetes mellitus without complication (HCC)    Hypertension     Patient Active Problem List   Diagnosis Date Noted   Type II diabetes mellitus (HCC) 09/24/2021   Current smoker 09/24/2021   Anxiety and depression 09/24/2021   BMI 31.0-31.9,adult 09/24/2021    Past Surgical History:  Procedure Laterality Date   APPENDECTOMY     GASTROSTOMY W/ FEEDING TUBE      OB History   No obstetric history on file.      Home Medications    Prior to Admission medications   Medication Sig Start Date End Date Taking? Authorizing Provider  sulfamethoxazole-trimethoprim (BACTRIM DS) 800-160 MG tablet Take 1 tablet by mouth 2 (two) times daily for 7 days. 05/08/22 05/15/22 Yes Shavontae Gibeault K, PA-C  Accu-Chek Softclix Lancets lancets Use to check blood sugar once daily. E11.69 01/09/22   Hoy Register, MD  albuterol (VENTOLIN HFA) 108 (90 Base)  MCG/ACT inhaler Inhale 1 puff into the lungs every 6 (six) hours as needed for wheezing or shortness of breath. 12/20/21   Georganna Skeans, MD  Blood Glucose Monitoring Suppl (ACCU-CHEK GUIDE) w/Device KIT Use to check blood sugar once daily. E11.69 01/09/22   Hoy Register, MD  Blood Glucose Monitoring Suppl (GE100 BLOOD GLUCOSE SYSTEM) w/Device KIT by Does not apply route. 10/05/18   [provider]  budesonide-formoterol (SYMBICORT) 160-4.5 MCG/ACT inhaler Inhale 2 puffs as needed into the lungs.     [provider]  budesonide-formoterol (SYMBICORT) 160-4.5 MCG/ACT inhaler 2 puff(s) inhaled 2 times a day    [provider]  buPROPion (WELLBUTRIN SR) 150 MG 12 hr tablet Take 1 tablet (150 mg total) by mouth 2 (two) times daily. 01/17/22   Georganna Skeans, MD  busPIRone (BUSPAR) 15 MG tablet Take 15 mg by mouth 2 (two) times daily. 10/30/17   [provider]  cyclobenzaprine (FLEXERIL) 5 MG tablet TAKE 1 TO 2 TABLETS BY MOUTH 2 (TWO) TIMES DAILY AS NEEDED FOR MUSCLE SPASMS. 04/12/22   Georganna Skeans, MD  FLUoxetine (PROZAC) 20 MG capsule Take 1 capsule (20 mg total) by mouth daily. 03/07/22   Georganna Skeans, MD  FLUoxetine (PROZAC) 20 MG tablet Take 1 tablet (20 mg total) by mouth daily. 12/20/21   Georganna Skeans, MD  glucose blood (ACCU-CHEK GUIDE) test strip Use to check blood sugar once daily. E11.69 01/09/22   Hoy Register, MD  hydrOXYzine (ATARAX) 50 MG tablet TAKE 1 TABLET (  50 MG TOTAL) BY MOUTH 2 (TWO) TIMES DAILY AS NEEDED. 04/29/22   Georganna Skeans, MD  hydrOXYzine (ATARAX/VISTARIL) 50 MG tablet  12/01/17   [provider]  lamoTRIgine (LAMICTAL) 100 MG tablet Take 100 mg by mouth daily. 10/30/17   [provider]  levofloxacin (LEVAQUIN) 750 MG tablet 1 tab(s) orally every 24 hours for 5 days 10/24/20   [provider]  loratadine (CLARITIN REDITABS) 10 MG dissolvable tablet 1 tab(s) orally once a day for allergies for 30 days     [provider]  Melatonin 10 MG CAPS 1 cap(s) orally once a day (at bedtime) for 30 days 12/03/19   [provider]  montelukast (SINGULAIR) 10 MG tablet SMARTSIG:1 Tablet(s) By Mouth Every Evening 11/14/21   [provider]  REXULTI 2 MG TABS Take 1 tablet by mouth daily. 10/30/17   [provider]  Rosuvastatin Calcium 10 MG CPSP 1 tab(s) orally once a day for 90 days    [provider]  sitaGLIPtin-metformin (JANUMET) 50-1000 MG tablet Take 1 tablet by mouth 2 (two) times daily with a meal. 05/06/22   Hoy Register, MD  traZODone (DESYREL) 100 MG tablet Take 100 mg by mouth at bedtime.    [provider]    Family History Family History  Problem Relation Age of Onset   Diabetes Father    Hypertension Father     Social History Social History   Tobacco Use   Smoking status: Every Day    Packs/day: 0.50    Types: Cigarettes   Smokeless tobacco: Never  Substance Use Topics   Alcohol use: No   Drug use: No     Allergies   Clindamycin/lincomycin and Penicillins   Review of Systems Review of Systems  Constitutional:  Positive for activity change. Negative for appetite change, fatigue and fever.  Respiratory:  Negative for cough and shortness of breath.   Cardiovascular:  Negative for chest pain.  Gastrointestinal:  Negative for abdominal pain, diarrhea, nausea and vomiting.  Genitourinary:  Positive for dysuria, frequency and urgency. Negative for flank pain, genital sores, hematuria, vaginal bleeding, vaginal discharge and vaginal pain.  Neurological:  Negative for dizziness, light-headedness and headaches.    Physical Exam Triage Vital Signs ED Triage Vitals  Enc Vitals Group     BP 05/08/22 1957 122/89     Pulse Rate 05/08/22 1957 (!) 107     Resp 05/08/22 1957 20     Temp 05/08/22 1957 (!) 97.1 F (36.2 C)     Temp Source 05/08/22 1957 Oral     SpO2 05/08/22 1957 99 %     Weight --      Height --      Head  Circumference --      Peak Flow --      Pain Score 05/08/22 1950 3     Pain Loc --      Pain Edu? --      Excl. in GC? --    No data found.  Updated Vital Signs BP 122/89 (BP Location: Left Arm)   Pulse (!) 107   Temp (!) 97.1 F (36.2 C) (Oral)   Resp 20   SpO2 99%   Visual Acuity Right Eye Distance:   Left Eye Distance:   Bilateral Distance:    Right Eye Near:   Left Eye Near:    Bilateral Near:     Physical Exam Vitals reviewed.  Constitutional:      General: She  is awake. She is not in acute distress.    Appearance: Normal appearance. She is well-developed. She is not ill-appearing.     Comments: Very pleasant female appears older than stated age in no acute distress  HENT:     Head: Normocephalic and atraumatic.  Cardiovascular:     Rate and Rhythm: Normal rate and regular rhythm.     Heart sounds: Normal heart sounds, S1 normal and S2 normal. No murmur heard. Pulmonary:     Effort: Pulmonary effort is normal.     Breath sounds: Normal breath sounds. No wheezing, rhonchi or rales.  Abdominal:     General: Bowel sounds are normal.     Palpations: Abdomen is soft.     Tenderness: There is no abdominal tenderness. There is no right CVA tenderness, left CVA tenderness, guarding or rebound.     Comments: Benign abdominal exam.  No CVA tenderness.  Psychiatric:        Behavior: Behavior is cooperative.     UC Treatments / Results  Labs (all labs ordered are listed, but only abnormal results are displayed) Labs Reviewed  POCT URINALYSIS DIP (MANUAL ENTRY) - Abnormal; Notable for the following components:      Result Value   Color, UA colorless (*)    Glucose, UA =500 (*)    Spec Grav, UA <=1.005 (*)    Blood, UA trace-intact (*)    Leukocytes, UA Small (1+) (*)    All other components within normal limits  URINE CULTURE    EKG   Radiology No results found.  Procedures Procedures (including critical care time)  Medications Ordered in  UC Medications - No data to display  Initial Impression / Assessment and Plan / UC Course  I have reviewed the triage vital signs and the nursing notes.  Pertinent labs & imaging results that were available during my care of the patient were reviewed by me and considered in my medical decision making (see chart for details).     Suspect that symptoms are related to glucosuria and uncontrolled blood sugar, however, given leukocyte Estrace on exam we will go ahead and cover for UTI.  Patient was started on Bactrim DS given penicillin allergy.  Discussed that if she develops any rash or oral lesions she should stop the medication to be seen immediately.  Culture was obtained and we discussed that if this is negative or if it requires a change in antibiotics we will contact her.  She is to rest and drink plenty fluid.  If anything worsens and she has severe abdominal pain, pelvic pain, fever, nausea, vomiting she needs to be seen emergently.  If symptoms not improving within a few days she should be reevaluated with either [place] or primary care.  Strict return precaution given.  Work excuse note provided.  Final Clinical Impressions(s) / UC Diagnoses   Final diagnoses:  Dysuria  Urinary frequency     Discharge Instructions      We are going to cover you for an infection.  It is possible that your blood sugar not being controlled is what is causing your symptoms.  Please work closely with your primary care provider for better blood sugar control.  Start Bactrim DS.  If you develop any oral lesions or rashes stop the medication and be seen immediately.  Make sure you are drinking plenty of fluid.  We will contact you with your culture results if we need to stop or change her antibiotics.  If anything  worsens and you develop fever, abdominal pain, nausea/vomiting, blood in your urine, pelvic pain, vaginal symptoms you need to be seen immediately.     ED Prescriptions     Medication Sig Dispense  Auth. Provider   sulfamethoxazole-trimethoprim (BACTRIM DS) 800-160 MG tablet Take 1 tablet by mouth 2 (two) times daily for 7 days. 14 tablet Corianna Avallone, Noberto Retort, PA-C      PDMP not reviewed this encounter.   Jeani Hawking, PA-C 05/08/22 2007    RaspetNoberto Retort, PA-C 05/08/22 2007

## 2022-05-08 NOTE — Discharge Instructions (Signed)
We are going to cover you for an infection.  It is possible that your blood sugar not being controlled is what is causing your symptoms.  Please work closely with your primary care provider for better blood sugar control.  Start Bactrim DS.  If you develop any oral lesions or rashes stop the medication and be seen immediately.  Make sure you are drinking plenty of fluid.  We will contact you with your culture results if we need to stop or change her antibiotics.  If anything worsens and you develop fever, abdominal pain, nausea/vomiting, blood in your urine, pelvic pain, vaginal symptoms you need to be seen immediately. ?

## 2022-05-08 NOTE — ED Triage Notes (Signed)
Pt here for dysuria x months with burning ?

## 2022-05-11 LAB — URINE CULTURE: Culture: >=100000 — AB

## 2022-05-17 ENCOUNTER — Telehealth: Payer: Self-pay | Admitting: Family Medicine

## 2022-05-17 NOTE — Telephone Encounter (Signed)
Pt called to report that she is completely out of her current supply, she is requesting a refill to last her until her appt.

## 2022-05-20 ENCOUNTER — Other Ambulatory Visit: Payer: Self-pay | Admitting: Family Medicine

## 2022-05-20 ENCOUNTER — Ambulatory Visit: Payer: Medicaid Other | Attending: Family Medicine | Admitting: Pharmacist

## 2022-05-20 DIAGNOSIS — E1169 Type 2 diabetes mellitus with other specified complication: Secondary | ICD-10-CM

## 2022-05-20 MED ORDER — OZEMPIC (0.25 OR 0.5 MG/DOSE) 2 MG/3ML ~~LOC~~ SOPN: 0.2500 mg | PEN_INJECTOR | SUBCUTANEOUS | 1 refills | Status: AC

## 2022-05-20 MED ORDER — TRAZODONE HCL 100 MG PO TABS: 100.0000 mg | ORAL_TABLET | Freq: Every day | ORAL | 2 refills | Status: DC

## 2022-05-20 MED ORDER — CYCLOBENZAPRINE HCL 5 MG PO TABS
ORAL_TABLET | ORAL | 0 refills | Status: DC
Start: 2022-05-20 — End: 2022-10-15

## 2022-05-20 MED ORDER — TRUEPLUS 5-BEVEL PEN NEEDLES 32G X 4 MM MISC: 2 refills | Status: AC

## 2022-05-20 MED ORDER — METFORMIN HCL ER 500 MG PO TB24: 500.0000 mg | ORAL_TABLET | Freq: Every day | ORAL | 2 refills | Status: AC

## 2022-05-20 NOTE — Progress Notes (Signed)
   S:    PCP: Dr. Redmond Pulling  No chief complaint on file.  Patient in good spirits.  Presents for diabetes evaluation, education, and management. Patient was referred and last seen by Primary Care Provider on 12/20/2021. I saw her on 05/06/2022 and increased her Janumet.   Today, she reports only taking Janumet once daily for fear of hypoglycemia. She denies any personal hx of thyroid cancer. Denies any hx of pancreatitis.   Family/Social History:  Fhx: DM, HTN Tobacco: 0.5 PPD current smoker  Alcohol: none   Insurance coverage/medication affordability: [place] Medicaid   Medication adherence reported but takes differently than prescribed.  Current diabetes medications include: Janumet 50-1000 mg BID (only taking once daily in the evenings)  Patient denies hypoglycemic events. No levels <70.  Patient reported dietary habits:  - "Sometimes it's a struggle"  - Does report that her diet has changed. Mainly has cut out snacking.  - Eats a lot of squash, zucchini. Tries to limit bread. - Tries to limit sugar-sweetened beverages. Drinks diet Mt. Dew.   Patient-reported exercise habits:  -none outside of work    Patient denies polyuria, polydipsia, polyphagia.  Patient denies neuropathy (nerve pain). Patient reports visual changes. Patient reports self foot exams.    O:  No results found for: HGBA1C There were no vitals filed for this visit.  Lipid Panel  No results found for: CHOL, TRIG, HDL, CHOLHDL, VLDL, LDLCALC, LDLDIRECT  Home fasting blood sugars: 176 - 306  2 hour post-meal/random blood sugars: 300s  Clinical Atherosclerotic Cardiovascular Disease (ASCVD): No  The ASCVD Risk score (Arnett DK, et al., 2019) failed to calculate for the following reasons:   The 2019 ASCVD risk score is only valid for ages 60 to 17   A/P: Diabetes longstanding currently uncontrolled. Patient is able to verbalize appropriate hypoglycemia management plan. Medication adherence appears appropriate. I  think a GLP-1 RA would be more effective for her. She agrees to try Ozempic and change Janumet to single-agent metformin. -Stop Janumet. -Start Ozempic 0.25 mg weekly.  -Start metformin 500 mg XR. Take 2 tablets ($RemoveBe'1000mg'[id number]$ ) daily in the evening.  -Extensively discussed pathophysiology of diabetes, recommended lifestyle interventions, dietary effects on blood sugar control -Counseled on s/sx of and management of hypoglycemia -A1c, CMP14+eGFR, lipid   Written patient instructions provided.  Total time in face to face counseling 30 minutes.  Follow up Pharmacist clinic in 4 weeks.   Benard Halsted, PharmD, [place], [place] [phone]

## 2022-05-21 LAB — LIPID PANEL
Chol/HDL Ratio: 11.7 ratio — ABNORMAL HIGH (ref 0.0–4.4)
Cholesterol, Total: 362 mg/dL — ABNORMAL HIGH (ref 100–199)
HDL: 31 mg/dL — ABNORMAL LOW (ref >39)
LDL Chol Calc (NIH): 203 mg/dL — ABNORMAL HIGH (ref 0–99)
Triglycerides: 587 mg/dL (ref 0–149)
VLDL Cholesterol Cal: 128 mg/dL — ABNORMAL HIGH (ref 5–40)

## 2022-05-21 LAB — CMP14+EGFR
ALT: 18 IU/L (ref 0–32)
AST: 13 IU/L (ref 0–40)
Albumin/Globulin Ratio: 1.6 (ref 1.2–2.2)
Albumin: 4.6 g/dL (ref 3.8–4.8)
Alkaline Phosphatase: 76 IU/L (ref 44–121)
BUN/Creatinine Ratio: 13 (ref 9–23)
BUN: 11 mg/dL (ref 6–20)
Bilirubin Total: <0.2 mg/dL (ref 0.0–1.2)
CO2: 20 mmol/L (ref 20–29)
Calcium: 9.6 mg/dL (ref 8.7–10.2)
Chloride: 98 mmol/L (ref 96–106)
Creatinine, Ser: 0.85 mg/dL (ref 0.57–1.00)
Globulin, Total: 2.8 g/dL (ref 1.5–4.5)
Glucose: 268 mg/dL — ABNORMAL HIGH (ref 70–99)
Potassium: 4.6 mmol/L (ref 3.5–5.2)
Sodium: 137 mmol/L (ref 134–144)
Total Protein: 7.4 g/dL (ref 6.0–8.5)
eGFR: 92 mL/min/{1.73_m2} (ref >59)

## 2022-05-21 LAB — HEMOGLOBIN A1C
Est. average glucose Bld gHb Est-mCnc: 283 mg/dL
Hgb A1c MFr Bld: 11.5 % — ABNORMAL HIGH (ref 4.8–5.6)

## 2022-05-28 ENCOUNTER — Telehealth (HOSPITAL_COMMUNITY): Payer: Self-pay | Admitting: Clinical

## 2022-05-28 ENCOUNTER — Ambulatory Visit: Payer: Self-pay | Admitting: *Deleted

## 2022-05-28 ENCOUNTER — Encounter (HOSPITAL_COMMUNITY): Payer: Self-pay

## 2022-05-28 ENCOUNTER — Ambulatory Visit (HOSPITAL_COMMUNITY): Payer: Medicaid Other | Admitting: Clinical

## 2022-05-28 NOTE — Telephone Encounter (Signed)
Message from [employer] sent at 05/28/2022 12:13 PM EDT  Summary: blood sugar concerns / rx follow up   The patient would like to speak with a member of staff about their blood sugar (238 checked during the time of call with agent)  as well as their medication concerns   Please contact further when available          Reason for Disposition  [1] Caller has NON-URGENT medication or insulin pump question AND [2] triager unable to answer question  Answer Assessment - Initial Assessment Questions 1. BLOOD GLUCOSE: "What is your blood glucose level?"     I returned pt's call.   She saw the pharmacist, Franky Macho and he changed her diabetes medications.   Her glucose 238 now.   It's been staying in the 200s since starting the Ozempic.   It was in 100s on the Jardiance.    I'm on Ozempic now and I am feeling like I have a UTI with burning with urination.   I want go back on the Jardiance.   Franky Macho said to call him if I was having problems. 2. ONSET: "When did you check the blood glucose?"     Within the last couple of hours. 3. USUAL RANGE: "What is your glucose level usually?" (e.g., usual fasting morning value, usual evening value)     Usually 100s on the Jardiance.   Since being on Ozempic it's staying in the 200s.    Denies having symptoms. 4. KETONES: "Do you check for ketones (urine or blood test strips)?" If yes, ask: "What does the test show now?"      N/A 5. TYPE 1 or 2:  "Do you know what type of diabetes you have?"  (e.g., Type 1, Type 2, Gestational; doesn't know)      Type 2 6. INSULIN: "Do you take insulin?" "What type of insulin(s) do you use? What is the mode of delivery? (syringe, pen; injection or pump)?"      Not asked 7. DIABETES PILLS: "Do you take any pills for your diabetes?" If yes, ask: "Have you missed taking any pills recently?"     Taking Ozempic now but want to go back on Jardiance 8. OTHER SYMPTOMS: "Do you have any symptoms?" (e.g., fever, frequent urination,  difficulty breathing, dizziness, weakness, vomiting)     Burning with urination since starting the Ozempic. 9. PREGNANCY: "Is there any chance you are pregnant?" "When was your last menstrual period?"     Not asked  Protocols used: Diabetes - High Blood Sugar-A-AH  Chief Complaint: Pt requesting Butch Penny RPH-CPP pharmacist change her medication back to Janumet from the Ozempic. Symptoms: Glucose is staying in the 200s and having burning with urination since starting the Ozempic.   On Janumet glucose stayed in 100s. Frequency: daily.   Glucose 238 now.    Pertinent Negatives: Patient denies having symptoms with the elevated glucose.  Disposition: [] ED /[] Urgent Care (no appt availability in office) / [] Appointment(In office/virtual)/ []  [place] [facility]/ [] Home Care/ [] Refused Recommended Disposition /[] [place] [facility]/ [x]  Follow-up with PCP Additional Notes: I have sent this message to [facility] for Ausdall RPH-CPP pharmacist.   Pt was agreeable to him calling her back. She has an appt 05/29/2022 with , PA

## 2022-05-28 NOTE — Telephone Encounter (Signed)
Correction to note.   It should be Janumet not Jardiance as mentioned in notes.

## 2022-05-28 NOTE — Telephone Encounter (Signed)
Just a FYI.  Patient going to mobile unit 05/29/2022

## 2022-05-28 NOTE — Telephone Encounter (Signed)
Therapist sent the client a link for the scheduled virtual appt for a clinical assessment. Client did not check in using the link sent via mychart. Therapist attempted to call the client. Client did not answer.

## 2022-05-29 ENCOUNTER — Telehealth: Payer: Self-pay | Admitting: Pharmacist

## 2022-05-29 ENCOUNTER — Ambulatory Visit: Payer: Medicaid Other | Admitting: Physician Assistant

## 2022-05-29 NOTE — Telephone Encounter (Signed)
Call placed to patient. Her blood sugars are high because she is just starting on Ozempic at the 0.25 mg weekly dose. Since she is tolerating this well, I recommend to increase to the 0.5 mg dose next week. Our goal is to get her to at least 1 mg once weekly and we should start to see improved glycemic control.

## 2022-06-06 ENCOUNTER — Ambulatory Visit: Payer: Medicaid Other | Admitting: Family Medicine

## 2022-06-25 ENCOUNTER — Ambulatory Visit: Payer: Medicaid Other | Admitting: Pharmacist

## 2022-06-27 ENCOUNTER — Ambulatory Visit: Payer: Medicaid Other | Admitting: Pharmacist

## 2022-06-28 ENCOUNTER — Other Ambulatory Visit: Payer: Self-pay | Admitting: Family Medicine

## 2022-06-28 NOTE — Telephone Encounter (Signed)
Requested Prescriptions  Pending Prescriptions Disp Refills  . hydrOXYzine (ATARAX) 50 MG tablet [Pharmacy Med Name: HYDROXYZINE HCL 50 MG ORAL TABLET] 180 tablet 0    Sig: TAKE 1 TABLET (50 MG TOTAL) BY MOUTH 2 (TWO) TIMES DAILY AS NEEDED.     Ear, Nose, and Throat:  Antihistamines 2 Passed - 06/28/2022  3:37 PM      Passed - Cr in normal range and within 360 days    Creatinine, Ser  Date Value Ref Range Status  05/20/2022 0.85 0.57 - 1.00 mg/dL Final         Passed - Valid encounter within last 12 months    Recent Outpatient Visits          1 month ago Type 2 diabetes mellitus with other specified complication, without long-term current use of insulin [facility])   [place] [facility] [place], Jeannett Senior L, RPH-CPP   1 month ago Type 2 diabetes mellitus with other specified complication, without long-term current use of insulin [facility])   [place] [facility] [place], Jeannett Senior L, RPH-CPP   5 months ago Anxiety and depression   Primary Care at [facility], MD   5 months ago Type 2 diabetes mellitus with other specified complication, without long-term current use of insulin [facility])   [place] [facility] [place], Jeannett Senior L, RPH-CPP   6 months ago Type 2 diabetes mellitus with other specified complication, with long-term current use of insulin [facility])   Primary Care at [facility], MD      Future Appointments            In 1 week Georganna Skeans, MD Primary Care at [facility]

## 2022-07-08 ENCOUNTER — Ambulatory Visit: Payer: Medicaid Other | Admitting: Family Medicine

## 2022-07-23 ENCOUNTER — Other Ambulatory Visit: Payer: Self-pay | Admitting: Family Medicine

## 2022-07-23 DIAGNOSIS — E1169 Type 2 diabetes mellitus with other specified complication: Secondary | ICD-10-CM

## 2022-08-13 ENCOUNTER — Other Ambulatory Visit: Payer: Self-pay | Admitting: Family Medicine

## 2022-08-13 DIAGNOSIS — E1169 Type 2 diabetes mellitus with other specified complication: Secondary | ICD-10-CM

## 2022-08-13 NOTE — Telephone Encounter (Signed)
Refilled 05/20/2022  30mL with1 refill  - 24 week supply. Requested Prescriptions  Pending Prescriptions Disp Refills  . OZEMPIC, 0.25 OR 0.5 MG/DOSE, 2 MG/3ML SOPN [Pharmacy Med Name: OZEMPIC (0.25 OR 0.5 MG/DOSE) 2 MG/3ML SUBCUTANEOUS SOLUTION PEN-INJECTOR] 3 mL 1    Sig: INJECT 0.25 MG INTO THE SKIN ONCE A WEEK.     Endocrinology:  Diabetes - GLP-1 Receptor Agonists - semaglutide Failed - 08/13/2022  4:08 PM      Failed - HBA1C in normal range and within 180 days    Hgb A1c MFr Bld  Date Value Ref Range Status  05/20/2022 11.5 (H) 4.8 - 5.6 % Final    Comment:             Prediabetes: 5.7 - 6.4          Diabetes: >6.4          Glycemic control for adults with diabetes: <7.0          Passed - Cr in normal range and within 360 days    Creatinine, Ser  Date Value Ref Range Status  05/20/2022 0.85 0.57 - 1.00 mg/dL Final         Passed - Valid encounter within last 6 months    Recent Outpatient Visits          2 months ago Type 2 diabetes mellitus with other specified complication, without long-term current use of insulin (HCC)   [facility] [place], Jeannett Senior L, RPH-CPP   3 months ago Type 2 diabetes mellitus with other specified complication, without long-term current use of insulin [facility])   [facility] [place], Jeannett Senior L, RPH-CPP   6 months ago Anxiety and depression   Primary Care at [facility], MD   7 months ago Type 2 diabetes mellitus with other specified complication, without long-term current use of insulin [facility])   [place] [facility] [place], Jeannett Senior L, RPH-CPP   7 months ago Type 2 diabetes mellitus with other specified complication, with long-term current use of insulin [facility])   Primary Care at [facility], MD      Future Appointments            In 2 weeks Georganna Skeans, MD Primary Care at [facility]

## 2022-08-15 LAB — HM DIABETES EYE EXAM

## 2022-08-29 ENCOUNTER — Ambulatory Visit: Payer: Medicaid Other | Admitting: Family Medicine

## 2022-09-04 ENCOUNTER — Ambulatory Visit: Payer: Medicaid Other | Admitting: Family Medicine

## 2022-09-25 ENCOUNTER — Ambulatory Visit: Payer: Medicaid Other | Admitting: Family Medicine

## 2022-09-26 ENCOUNTER — Other Ambulatory Visit: Payer: Self-pay | Admitting: Family Medicine

## 2022-09-26 ENCOUNTER — Telehealth: Payer: Self-pay | Admitting: Emergency Medicine

## 2022-09-26 DIAGNOSIS — E1169 Type 2 diabetes mellitus with other specified complication: Secondary | ICD-10-CM

## 2022-09-26 NOTE — Telephone Encounter (Signed)
D/C 05/20/22. Requested Prescriptions  Signed Prescriptions Disp Refills   traZODone (DESYREL) 100 MG tablet 30 tablet 2    Sig: TAKE 1 TABLET (100 MG TOTAL) BY MOUTH AT BEDTIME.     Psychiatry: Antidepressants - Serotonin Modulator Passed - 09/26/2022  9:15 AM      Passed - Completed PHQ-2 or PHQ-9 in the last 360 days      Passed - Valid encounter within last 6 months    Recent Outpatient Visits          4 months ago Type 2 diabetes mellitus with other specified complication, without long-term current use of insulin [facility])   [place], Annie Main L, RPH-CPP   4 months ago Type 2 diabetes mellitus with other specified complication, without long-term current use of insulin [facility])   [place], Annie Main L, RPH-CPP   8 months ago Anxiety and depression   Primary Care at [facility], MD   8 months ago Type 2 diabetes mellitus with other specified complication, without long-term current use of insulin [facility])   [place], Annie Main L, RPH-CPP   9 months ago Type 2 diabetes mellitus with other specified complication, with long-term current use of insulin [facility])   Primary Care at [facility], MD      Future Appointments            In 5 days Dorna Mai, MD Primary Care at [place]  . JANUMET 50-1000 MG tablet [Pharmacy Med Name: JANUMET 50-1000 MG ORAL TABLET] 60 tablet 2    Sig: TAKE 1 TABLET BY MOUTH 2 (TWO) TIMES DAILY WITH A MEAL.     Endocrinology:  Diabetes - Biguanide + DPP-4 Inhibitor Combos Failed - 09/26/2022  9:15 AM      Failed - HBA1C is between 0 and 7.9 and within 180 days    Hgb A1c MFr Bld  Date Value Ref Range Status  05/20/2022 11.5 (H) 4.8 - 5.6 % Final    Comment:             Prediabetes: 5.7 - 6.4          Diabetes: >6.4          Glycemic control for  adults with diabetes: <7.0          Failed - B12 Level in normal range and within 720 days    No results found for: "VITAMINB12"       Failed - CBC within normal limits and completed in the last 12 months    WBC  Date Value Ref Range Status  11/07/2017 13.7 (H) 4.0 - 10.5 K/uL Final   RBC  Date Value Ref Range Status  11/07/2017 5.03 3.87 - 5.11 MIL/uL Final   Hemoglobin  Date Value Ref Range Status  08/04/2018 13.9 12.0 - 15.0 g/dL Final   HCT  Date Value Ref Range Status  08/04/2018 41.0 36.0 - 46.0 % Final   MCHC  Date Value Ref Range Status  11/07/2017 33.4 30.0 - 36.0 g/dL Final   [facility]  Date Value Ref Range Status  11/07/2017 28.6 26.0 - 34.0 pg Final   MCV  Date Value Ref Range Status  11/07/2017 85.7 78.0 - 100.0 fL Final   No results found for: "PLTCOUNTKUC", "LABPLAT", "POCPLA" RDW  Date Value  Ref Range Status  11/07/2017 13.2 11.5 - 15.5 % Final         Passed - Cr in normal range and within 360 days    Creatinine, Ser  Date Value Ref Range Status  05/20/2022 0.85 0.57 - 1.00 mg/dL Final         Passed - eGFR in normal range and within 360 days    GFR calc Af Amer  Date Value Ref Range Status  11/07/2017 >60 >60 mL/min Final    Comment:    (NOTE) The eGFR has been calculated using the CKD EPI equation. This calculation has not been validated in all clinical situations. eGFR's persistently <60 mL/min signify possible Chronic Kidney Disease.    GFR calc non Af Amer  Date Value Ref Range Status  11/07/2017 >60 >60 mL/min Final   eGFR  Date Value Ref Range Status  05/20/2022 92 >59 mL/min/1.73 Final         Passed - Valid encounter within last 6 months    Recent Outpatient Visits          4 months ago Type 2 diabetes mellitus with other specified complication, without long-term current use of insulin [facility])   [place], Annie Main L, RPH-CPP   4 months ago Type 2 diabetes mellitus with other  specified complication, without long-term current use of insulin [facility])   [place], Annie Main L, RPH-CPP   8 months ago Anxiety and depression   Primary Care at [facility], MD   8 months ago Type 2 diabetes mellitus with other specified complication, without long-term current use of insulin [facility])   [place], Annie Main L, RPH-CPP   9 months ago Type 2 diabetes mellitus with other specified complication, with long-term current use of insulin [facility])   Primary Care at [facility], MD      Future Appointments            In 5 days Dorna Mai, MD Primary Care at [facility]

## 2022-09-26 NOTE — Telephone Encounter (Signed)
Copied from [place] [id number]. Topic: Appointment Scheduling - Scheduling Inquiry for Clinic >> Sep 26, 2022  9:24 AM Everette C wrote: Reason for CRM: The patient would like to schedule an rx refill appt with Dr. Lurena Joiner virtually   Please contact further when possible

## 2022-09-27 NOTE — Telephone Encounter (Signed)
Patient is scheduled for visit with pcp on 10/3. Please follow up with patient to schedule appointment with [facility] after 10/3.

## 2022-10-01 ENCOUNTER — Ambulatory Visit: Payer: Medicaid Other | Admitting: Family Medicine

## 2022-10-15 ENCOUNTER — Encounter: Payer: Self-pay | Admitting: Family Medicine

## 2022-10-15 ENCOUNTER — Ambulatory Visit (INDEPENDENT_AMBULATORY_CARE_PROVIDER_SITE_OTHER): Payer: Medicaid Other | Admitting: Family Medicine

## 2022-10-15 VITALS — BP 95/67 | HR 95 | Temp 98.1°F | Resp 16 | Wt 147.0 lb

## 2022-10-15 DIAGNOSIS — Z794 Long term (current) use of insulin: Secondary | ICD-10-CM

## 2022-10-15 DIAGNOSIS — M62838 Other muscle spasm: Secondary | ICD-10-CM | POA: Diagnosis not present

## 2022-10-15 DIAGNOSIS — E1169 Type 2 diabetes mellitus with other specified complication: Secondary | ICD-10-CM | POA: Diagnosis not present

## 2022-10-15 DIAGNOSIS — N3001 Acute cystitis with hematuria: Secondary | ICD-10-CM | POA: Diagnosis not present

## 2022-10-15 DIAGNOSIS — F419 Anxiety disorder, unspecified: Secondary | ICD-10-CM

## 2022-10-15 DIAGNOSIS — F32A Depression, unspecified: Secondary | ICD-10-CM

## 2022-10-15 DIAGNOSIS — R300 Dysuria: Secondary | ICD-10-CM

## 2022-10-15 LAB — POCT GLYCOSYLATED HEMOGLOBIN (HGB A1C): Hemoglobin A1C: 10 % — AB (ref 4.0–5.6)

## 2022-10-15 LAB — POCT URINALYSIS DIP (CLINITEK)
Bilirubin, UA: NEGATIVE
Glucose, UA: >=1000 mg/dL — AB
Nitrite, UA: POSITIVE — AB
POC PROTEIN,UA: 30 — AB
Spec Grav, UA: 1.02 (ref 1.010–1.025)
Urobilinogen, UA: 0.2 E.U./dL
pH, UA: 6 (ref 5.0–8.0)

## 2022-10-15 MED ORDER — CYCLOBENZAPRINE HCL 5 MG PO TABS: ORAL_TABLET | ORAL | 1 refills | Status: DC

## 2022-10-15 MED ORDER — HYDROXYZINE HCL 50 MG PO TABS: 50.0000 mg | ORAL_TABLET | Freq: Two times a day (BID) | ORAL | 0 refills | Status: AC | PRN

## 2022-10-15 MED ORDER — NITROFURANTOIN MONOHYD MACRO 100 MG PO CAPS: 100.0000 mg | ORAL_CAPSULE | Freq: Two times a day (BID) | ORAL | 0 refills | Status: AC

## 2022-10-15 MED ORDER — FLUOXETINE HCL 20 MG PO CAPS: 20.0000 mg | ORAL_CAPSULE | Freq: Every day | ORAL | 1 refills | Status: AC

## 2022-10-15 MED ORDER — JANUMET 50-1000 MG PO TABS: 1.0000 | ORAL_TABLET | Freq: Two times a day (BID) | ORAL | 1 refills | Status: AC

## 2022-10-15 NOTE — Progress Notes (Unsigned)
Patient is her for medication refill and f/u chronic issues. Patient has no new concerns today

## 2022-10-16 NOTE — Progress Notes (Signed)
Established Patient Office Visit  Subjective    Patient ID: Marilyn Joseph, female    DOB: 11-01-1986  Age: 36 y.o. MRN: [id number]  CC: No chief complaint on file.   HPI Marilyn Joseph presents for routine follow up of chronic med issues including diabetes. Patient reports that she has pain and burning with urination.    Outpatient Encounter Medications as of 10/15/2022  Medication Sig   Accu-Chek Softclix Lancets lancets Use to check blood sugar once daily. E11.69   albuterol (VENTOLIN HFA) 108 (90 Base) MCG/ACT inhaler Inhale 1 puff into the lungs every 6 (six) hours as needed for wheezing or shortness of breath.   Blood Glucose Monitoring Suppl (ACCU-CHEK GUIDE) w/Device KIT Use to check blood sugar once daily. E11.69   Blood Glucose Monitoring Suppl (GE100 BLOOD GLUCOSE SYSTEM) w/Device KIT by Does not apply route.   budesonide-formoterol (SYMBICORT) 160-4.5 MCG/ACT inhaler Inhale 2 puffs as needed into the lungs.    budesonide-formoterol (SYMBICORT) 160-4.5 MCG/ACT inhaler 2 puff(s) inhaled 2 times a day   FLUoxetine (PROZAC) 20 MG tablet Take 1 tablet (20 mg total) by mouth daily.   glucose blood (ACCU-CHEK GUIDE) test strip Use to check blood sugar once daily. E11.69   hydrOXYzine (ATARAX/VISTARIL) 50 MG tablet    Insulin Pen Needle (TRUEPLUS 5-BEVEL PEN NEEDLES) 32G X 4 MM MISC Use as instructed to inject Ozempic.   lamoTRIgine (LAMICTAL) 100 MG tablet Take 100 mg by mouth daily.   levofloxacin (LEVAQUIN) 750 MG tablet 1 tab(s) orally every 24 hours for 5 days   loratadine (CLARITIN REDITABS) 10 MG dissolvable tablet 1 tab(s) orally once a day for allergies for 30 days   Melatonin 10 MG CAPS 1 cap(s) orally once a day (at bedtime) for 30 days   metFORMIN (GLUCOPHAGE-XR) 500 MG 24 hr tablet Take 1 tablet (500 mg total) by mouth daily with breakfast.   montelukast (SINGULAIR) 10 MG tablet SMARTSIG:1 Tablet(s) By Mouth Every Evening   nitrofurantoin, macrocrystal-monohydrate,  (MACROBID) 100 MG capsule Take 1 capsule (100 mg total) by mouth 2 (two) times daily.   REXULTI 2 MG TABS Take 1 tablet by mouth daily.   Rosuvastatin Calcium 10 MG CPSP 1 tab(s) orally once a day for 90 days   traZODone (DESYREL) 100 MG tablet TAKE 1 TABLET (100 MG TOTAL) BY MOUTH AT BEDTIME.   [DISCONTINUED] cyclobenzaprine (FLEXERIL) 5 MG tablet TAKE 1 TO 2 TABLETS BY MOUTH 2 (TWO) TIMES DAILY AS NEEDED FOR MUSCLE SPASMS.   [DISCONTINUED] FLUoxetine (PROZAC) 20 MG capsule Take 1 capsule (20 mg total) by mouth daily.   [DISCONTINUED] hydrOXYzine (ATARAX) 50 MG tablet TAKE 1 TABLET (50 MG TOTAL) BY MOUTH 2 (TWO) TIMES DAILY AS NEEDED.   [DISCONTINUED] JANUMET 50-1000 MG tablet Take 1 tablet by mouth 2 (two) times daily.   buPROPion (WELLBUTRIN SR) 150 MG 12 hr tablet Take 1 tablet (150 mg total) by mouth 2 (two) times daily. (Patient not taking: Reported on 10/15/2022)   busPIRone (BUSPAR) 15 MG tablet Take 15 mg by mouth 2 (two) times daily. (Patient not taking: Reported on 10/15/2022)   cyclobenzaprine (FLEXERIL) 5 MG tablet TAKE 1 TO 2 TABLETS BY MOUTH 2 (TWO) TIMES DAILY AS NEEDED FOR MUSCLE SPASMS.   FLUoxetine (PROZAC) 20 MG capsule Take 1 capsule (20 mg total) by mouth daily.   hydrOXYzine (ATARAX) 50 MG tablet Take 1 tablet (50 mg total) by mouth 2 (two) times daily as needed.   JANUMET 50-1000 MG tablet Take 1  tablet by mouth 2 (two) times daily.   Semaglutide,0.25 or 0.5MG/DOS, (OZEMPIC, 0.25 OR 0.5 MG/DOSE,) 2 MG/3ML SOPN Inject 0.25 mg into the skin once a week. (Patient not taking: Reported on 10/15/2022)   No facility-administered encounter medications on file as of 10/15/2022.    Past Medical History:  Diagnosis Date   Depression    Diabetes mellitus without complication ([place])    Hypertension     Past Surgical History:  Procedure Laterality Date   APPENDECTOMY     GASTROSTOMY W/ FEEDING TUBE      Family History  Problem Relation Age of Onset   Diabetes Father     Hypertension Father     Social History   Socioeconomic History   Marital status: Single    Spouse name: Not on file   Number of children: Not on file   Years of education: Not on file   Highest education level: Not on file  Occupational History   Not on file  Tobacco Use   Smoking status: Every Day    Packs/day: 0.50    Types: Cigarettes   Smokeless tobacco: Never  Substance and Sexual Activity   Alcohol use: No   Drug use: No   Sexual activity: Not on file  Other Topics Concern   Not on file  Social History Narrative   Not on file   Social Determinants of Health   Financial Resource Strain: Not on file  Food Insecurity: Not on file  Transportation Needs: Not on file  Physical Activity: Not on file  Stress: Not on file  Social Connections: Not on file  Intimate Partner Violence: Not on file    Review of Systems  Genitourinary:  Positive for dysuria. Negative for flank pain.  All other systems reviewed and are negative.       Objective    BP 95/67   Pulse 95   Temp 98.1 F (36.7 C) (Oral)   Resp 16   Wt 147 lb 0.3 oz (66.7 kg)   SpO2 96%   BMI 28.95 kg/m   Physical Exam Vitals and nursing note reviewed.  Constitutional:      General: She is not in acute distress. Cardiovascular:     Rate and Rhythm: Normal rate and regular rhythm.  Pulmonary:     Effort: Pulmonary effort is normal.     Breath sounds: Normal breath sounds.  Abdominal:     Palpations: Abdomen is soft.     Tenderness: There is no abdominal tenderness.  Neurological:     General: No focal deficit present.     Mental Status: She is alert and oriented to person, place, and time.         Assessment & Plan:   1. Type 2 diabetes mellitus with other specified complication, with long-term current use of insulin (HCC) A1c is improved but not near goal. Discussed compliance. Meds refilled.  - POCT glycosylated hemoglobin (Hb A1C)  2. Anxiety and depression Appears stable. Prozac  and hydroxyzine prescribed.   3. Muscle spasm Flexeril refilled.    4. Acute cystitis with hematuria Macrobid prescribed - POCT URINALYSIS DIP (CLINITEK)   Return in about 3 months (around 01/15/2023) for follow up.   Becky Sax, MD

## 2022-11-25 ENCOUNTER — Other Ambulatory Visit: Payer: Self-pay | Admitting: Family Medicine

## 2022-11-25 DIAGNOSIS — E1169 Type 2 diabetes mellitus with other specified complication: Secondary | ICD-10-CM

## 2023-01-15 ENCOUNTER — Ambulatory Visit: Payer: Medicaid Other | Admitting: Family Medicine

## 2023-01-22 ENCOUNTER — Telehealth: Payer: Medicaid Other | Admitting: Family

## 2023-01-22 ENCOUNTER — Ambulatory Visit: Payer: Medicaid Other | Admitting: Family Medicine
